# Patient Record
Sex: Male | Born: 1995 | Race: Black or African American | Hispanic: No | Marital: Married | Smoking: Current every day smoker
Health system: Southern US, Community
[De-identification: ages and names within clinical notes are randomized; demographics above are authoritative.]

## PROBLEM LIST (undated history)

## (undated) DIAGNOSIS — J039 Acute tonsillitis, unspecified: Secondary | ICD-10-CM

## (undated) HISTORY — PX: MOUTH SURGERY: SHX715

## (undated) HISTORY — PX: CHOLECYSTECTOMY: SHX55

---
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
## 2013-01-24 ENCOUNTER — Emergency Department (HOSPITAL_COMMUNITY)
Admission: EM | Admit: 2013-01-24 | Discharge: 2013-01-24 | Discharge status: Absent without leave | Payer: Self-pay | Source: Home / Self Care | Attending: Family Medicine | Admitting: Family Medicine

## 2013-01-29 ENCOUNTER — Emergency Department (INDEPENDENT_AMBULATORY_CARE_PROVIDER_SITE_OTHER)
Admission: EM | Admit: 2013-01-29 | Discharge: 2013-01-29 | Disposition: A | Discharge status: Home / Self Care | Payer: Self-pay | Source: Home / Self Care | Attending: Emergency Medicine | Admitting: Emergency Medicine

## 2013-01-29 ENCOUNTER — Encounter (HOSPITAL_COMMUNITY): Payer: Self-pay | Admitting: Emergency Medicine

## 2013-01-29 DIAGNOSIS — J309 Allergic rhinitis, unspecified: Secondary | ICD-10-CM

## 2013-01-29 DIAGNOSIS — J302 Other seasonal allergic rhinitis: Secondary | ICD-10-CM

## 2013-01-29 MED ORDER — FLUTICASONE PROPIONATE 50 MCG/ACT NA SUSP: 2.0000 | Freq: Every day | NASAL | Status: DC

## 2013-01-29 NOTE — ED Notes (Signed)
Eyes are itching and watery, sniffling, and denies cough.  Reports symptoms worsen the longer he is outside

## 2013-02-12 NOTE — ED Provider Notes (Signed)
History     CSN: [id number]  Arrival date & time 01/29/13  1232   First MD Initiated Contact with Patient 01/29/13 1346      Chief Complaint  Patient presents with  . Allergies     HPI Pt reports a > 1 weeK h/o seasonal allergy type symptoms that have not responded to OTC allergy meds. States he has had watery, itchy eyes, increased PND, sneezing and runny nose. OTC Claritin not helping. Pt admits to h/o "allergies" since childhood. Has taken meds in the past but unsure of names. Denies fever or cough.   Past Medical History  Diagnosis Date  . Tonsillitis     Past Surgical History  Procedure Laterality Date  . Cholecystectomy      Family History  Problem Relation Age of Onset  . Hypertension Father     History  Substance Use Topics  . Smoking status: Current Every Day Smoker -- 0.50 packs/day  . Smokeless tobacco: Not on file  . Alcohol Use: No     Comment: social      Review of Systems  Constitutional: Negative.   HENT: Positive for congestion, rhinorrhea, sneezing and postnasal drip. Negative for ear pain, nosebleeds, sore throat, facial swelling, sinus pressure and ear discharge.   Eyes: Positive for redness and itching.  Respiratory: Negative for cough, chest tightness, shortness of breath and wheezing.   Cardiovascular: Negative.   Gastrointestinal: Negative.   Endocrine: Negative.   Genitourinary: Negative.   Musculoskeletal: Negative.   Skin: Negative.   Allergic/Immunologic: Positive for environmental allergies.  Neurological: Negative.   Hematological: Negative.   Psychiatric/Behavioral: Negative.     Allergies  Review of patient's allergies indicates no known allergies.  Home Medications   Current Outpatient Rx  Name  Route  Sig  Dispense  Refill  . benzonatate (TESSALON) 200 MG capsule   Oral   Take 1 capsule (200 mg total) by mouth 3 (three) times daily as needed for cough.   30 capsule   0   . clindamycin (CLEOCIN) 300 MG capsule    Oral   Take 300 mg by mouth 4 (four) times daily.         . fluticasone (FLONASE) 50 MCG/ACT nasal spray   Nasal   Place 2 sprays into the nose daily. For 7 days then 1 spray in each nostril daily thereafter.   16 g   2   . naproxen (NAPROSYN) 500 MG tablet   Oral   Take 1 tablet (500 mg total) by mouth 2 (two) times daily.   30 tablet   0   . Polyethyl Glycol-Propyl Glycol (SYSTANE) 0.4-0.3 % SOLN   Ophthalmic   Apply 1 drop to eye every 3 (three) hours.   10 mL   0   . predniSONE (DELTASONE) 20 MG tablet   Oral   Take 40 mg by mouth daily.         . predniSONE (DELTASONE) 20 MG tablet   Oral   Take 2 tablets (40 mg total) by mouth daily.   10 tablet   0   . SUMAtriptan (IMITREX) 50 MG tablet   Oral   Take 1 tablet (50 mg total) by mouth every 2 (two) hours as needed for migraine.   10 tablet   0   . SUMAtriptan (IMITREX) 50 MG tablet   Oral   Take 1 tablet (50 mg total) by mouth every 2 (two) hours as needed for migraine.   10 tablet  0     BP 129/77  Pulse 84  Temp(Src) 97.9 F (36.6 C) (Oral)  Resp 16  SpO2 100%  Physical Exam  Constitutional: He is oriented to person, place, and time. He appears well-developed and well-nourished.  HENT:  Head: Normocephalic and atraumatic.  Right Ear: Tympanic membrane, external ear and ear canal normal.  Left Ear: Tympanic membrane, external ear and ear canal normal.  Nose: Rhinorrhea present.  Mouth/Throat: Uvula is midline, oropharynx is clear and moist and mucous membranes are normal.  Cobblestoning  Eyes: Conjunctivae are normal.  Neck: Neck supple.  Cardiovascular: Normal rate and regular rhythm.   Pulmonary/Chest: Effort normal and breath sounds normal.  Musculoskeletal: Normal range of motion.  Neurological: He is alert and oriented to person, place, and time.  Skin: Skin is warm and dry.  Psychiatric: He has a normal mood and affect.    ED Course  Procedures (including critical care  time)  Labs Reviewed - No data to display No results found.   1. Seasonal allergies       MDM  Seasonal allergy symptoms unrelieved by OTC remedies. H/o same since childhood. No fever or cough. Will treat with Flonase, Zatidor and encourage pt to continue an OTC anti-allergy medication.         Roma Kayser Sundiata Ferrick, NP 02/18/13 [phone]

## 2013-02-18 NOTE — ED Provider Notes (Signed)
Medical screening examination/treatment/procedure(s) were performed by non-physician practitioner and as supervising physician I was immediately available for consultation/collaboration.  Leslee Home, M.D.   Reuben Likes, MD 02/18/13 [phone]

## 2013-03-26 ENCOUNTER — Encounter (HOSPITAL_COMMUNITY): Payer: Self-pay | Admitting: Emergency Medicine

## 2013-03-26 ENCOUNTER — Emergency Department (HOSPITAL_COMMUNITY)
Admission: EM | Admit: 2013-03-26 | Discharge: 2013-03-26 | Discharge status: Against Medical Advice | Payer: Self-pay | Attending: Emergency Medicine | Admitting: Emergency Medicine

## 2013-03-26 DIAGNOSIS — R6883 Chills (without fever): Secondary | ICD-10-CM | POA: Insufficient documentation

## 2013-03-26 DIAGNOSIS — R1110 Vomiting, unspecified: Secondary | ICD-10-CM

## 2013-03-26 DIAGNOSIS — R059 Cough, unspecified: Secondary | ICD-10-CM | POA: Insufficient documentation

## 2013-03-26 DIAGNOSIS — F17200 Nicotine dependence, unspecified, uncomplicated: Secondary | ICD-10-CM | POA: Insufficient documentation

## 2013-03-26 DIAGNOSIS — R05 Cough: Secondary | ICD-10-CM | POA: Insufficient documentation

## 2013-03-26 DIAGNOSIS — R112 Nausea with vomiting, unspecified: Secondary | ICD-10-CM | POA: Insufficient documentation

## 2013-03-26 DIAGNOSIS — Z9089 Acquired absence of other organs: Secondary | ICD-10-CM | POA: Insufficient documentation

## 2013-03-26 DIAGNOSIS — R6889 Other general symptoms and signs: Secondary | ICD-10-CM | POA: Insufficient documentation

## 2013-03-26 DIAGNOSIS — R1084 Generalized abdominal pain: Secondary | ICD-10-CM | POA: Insufficient documentation

## 2013-03-26 DIAGNOSIS — J351 Hypertrophy of tonsils: Secondary | ICD-10-CM | POA: Insufficient documentation

## 2013-03-26 MED ORDER — ONDANSETRON 4 MG PO TBDP
4.0000 mg | ORAL_TABLET | Freq: Once | ORAL | Status: AC
  Administered 2013-03-26: 4 mg via ORAL
  Filled 2013-03-26: qty 1

## 2013-03-26 NOTE — ED Notes (Signed)
Onset of vomiting this AM.  Vomiting X 3.

## 2013-03-26 NOTE — ED Notes (Signed)
"  I already got something to drink from my girlfriend." Pt tolerated well.

## 2013-03-26 NOTE — ED Notes (Signed)
Pt is ready to be discharged.  MD notified.

## 2013-03-26 NOTE — ED Notes (Signed)
Pt walking out, "I can't wait anymore."

## 2013-03-26 NOTE — ED Provider Notes (Signed)
History     CSN: [id number]  Arrival date & time 03/26/13  4098   First MD Initiated Contact with Patient 03/26/13 [phone]      Chief Complaint  Patient presents with  . Emesis   (Consider location/radiation/quality/duration/timing/severity/associated sxs/prior treatment) HPI Comments: John Skinner is a 17 year old male with PMH s/p cholecystectomy, tonsillitis and seasonal allergies presenting to [facility] with complaints of vomiting x3 episodes since this morning and associated with productive cough x2 days.  His wife and baby are present in the room as well.  He explains that yesterday he developed a productive cough with brownish sputum that was worse at night and resulted in dry heaving.  This morning when he woke up around 7am, he was coughing, his stomach felt upset and had nausea that resulted in 3 large episodes of vomiting.  His wife explains that he vomited at last half a cup of undigested food,stomach acid, and liquid but denies any blood. He experienced abdominal pain with vomiting which subsequently resolved with resolution of vomiting.  Currently he still has nausea and does not have much of an appetite.  He also reports having chills this morning.  He denies any sick contacts, was able to eat lunch and dinner yesterday and had hamburger helper with his family around 10pm.  His wife ate the same things and she does not feel ill.  He denies any diarrhea or constipation, headaches, chest pain, shortness of breath, sore throat, hematemesis, or any urinary complaints at this time.  He reports prior similar episode several years ago when he had a virus and resolved over time.  He does smoke approximately 2-3 cigarettes per day, had a shot of liquor 2 days ago, and last marijuana use was approximately 1 year ago.  He denies any other illicit drug use.    Patient is a 17 y.o. male presenting with vomiting. The history is provided by the patient and the spouse. No language interpreter was used.   Emesis Severity:  Moderate Duration:  1 day Timing:  Intermittent Number of daily episodes:  3 so far Quality:  Undigested food and stomach contents Progression:  Unchanged Chronicity:  New Recent urination:  Normal Context: post-tussive   Context: not self-induced   Relieved by:  None tried Worsened by:  Nothing tried Ineffective treatments:  None tried Associated symptoms: abdominal pain, chills and cough   Associated symptoms: no arthralgias, no diarrhea, no fever, no headaches, no myalgias, no sore throat and no URI   Abdominal pain:    Location:  Generalized   Quality:  Aching (with vomiting)   Severity:  Mild   Onset quality:  Sudden   Timing:  Intermittent   Progression:  Resolved   Chronicity:  New Cough:    Cough characteristics:  Productive   Sputum characteristics:  Rusty and brown   Severity:  Mild   Onset quality:  Sudden   Duration:  2 days   Timing:  Sporadic   Progression:  Unchanged   Chronicity:  New Risk factors: alcohol use and prior abdominal surgery   Risk factors: no diabetes, no sick contacts and no travel to endemic areas   Risk factors comment:  Drank alcohol two days prior, hx of cholecystectomy, no sick contacts, ate hamburger helper last night   Past Medical History  Diagnosis Date  . Tonsillitis     Past Surgical History  Procedure Laterality Date  . Cholecystectomy     Family History  Problem Relation Age of  Onset  . Hypertension Father    History  Substance Use Topics  . Smoking status: Current Every Day Smoker -- 0.50 packs/day  . Smokeless tobacco: Not on file  . Alcohol Use: Yes     Comment: social     Review of Systems  Constitutional: Positive for chills and appetite change. Negative for fever.       Decreased appetite  HENT: Positive for sneezing. Negative for congestion, sore throat, rhinorrhea, trouble swallowing and neck stiffness.   Eyes: Negative.  Negative for visual disturbance.  Respiratory: Positive for  cough. Negative for chest tightness and shortness of breath.   Cardiovascular: Negative.  Negative for chest pain.  Gastrointestinal: Positive for vomiting and abdominal pain. Negative for diarrhea, constipation and blood in stool.  Endocrine: Negative.   Genitourinary: Negative.  Negative for dysuria and hematuria.  Musculoskeletal: Negative.  Negative for myalgias and arthralgias.  Skin: Negative.   Allergic/Immunologic: Negative.   Neurological: Negative.  Negative for weakness and headaches.  Hematological: Negative.   Psychiatric/Behavioral: Negative.    Allergies  Review of patient's allergies indicates no known allergies.  Home Medications   Current Outpatient Rx  Name  Route  Sig  Dispense  Refill  . SUMAtriptan (IMITREX) 50 MG tablet   Oral   Take 1 tablet (50 mg total) by mouth every 2 (two) hours as needed for migraine.   10 tablet   0    BP 125/68  Pulse 76  Temp(Src) 98 F (36.7 C) (Oral)  Resp 16  SpO2 100%  Physical Exam  Constitutional: He is oriented to person, place, and time. He appears well-developed and well-nourished. No distress.  HENT:  Head: Normocephalic and atraumatic.  Pharyngeal erythema, enlarged tonsils   Eyes: EOM are normal. Pupils are equal, round, and reactive to light.  Neck: Normal range of motion. Neck supple.  Cardiovascular: Normal rate, regular rhythm, normal heart sounds and intact distal pulses.   Pulmonary/Chest: Effort normal and breath sounds normal. He has no wheezes. He exhibits no tenderness.  Abdominal: Soft. Bowel sounds are normal. He exhibits no distension. There is no tenderness. There is no guarding.  Musculoskeletal: Normal range of motion. He exhibits no edema and no tenderness.  Neurological: He is alert and oriented to person, place, and time. No cranial nerve deficit.  Skin: Skin is warm and dry. He is not diaphoretic.  Psychiatric: He has a normal mood and affect. His behavior is normal. Judgment and thought  content normal.   ED Course  Procedures (including critical care time)  Labs Reviewed  URINALYSIS, ROUTINE W REFLEX MICROSCOPIC   No results found.   No diagnosis found.    MDM  John Skinner is a 17 year old male s/p cholecystectomy with hx of tonsilitis presenting with 3 episodes of non-bloody post tussive emesis x3 associated with intermittent abdominal pain x 1 day.  Productive cough with brown sputum x2 days.  No sick contacts, afebrile, but did have chills.    -zofran--nausea resolved -u/a -cxr    John Skinner left the emergency room against medical advice today.  Case discussed with Dr. Carmie End, MD 03/26/13 1120

## 2013-03-26 NOTE — ED Notes (Addendum)
Pt was encouraged to stay and continues to want to leave AMA.  Paper was not signed. MD notified.

## 2013-03-26 NOTE — ED Notes (Signed)
MD at bedside. 

## 2013-03-26 NOTE — ED Provider Notes (Signed)
I saw and evaluated the patient, reviewed the resident's note and I agree with the findings and plan. If applicable, I agree with the resident's interpretation of the EKG.  If applicable, I was present for critical portions of any procedures performed.  Vomiting x 3 since this morning. Productive cough x 2 days. No current abdominal pain. Chills without fever. No sick contacts or antibiotics use.  Appears well, moist mucus membranes, abdomen soft. Left before being discharged  Glynn Octave, MD 03/26/13 1734

## 2013-03-26 NOTE — ED Notes (Signed)
This RN into the room to medicate patient. RN informed patient of need for urine sample.  He states "I don't know why they are doing this extra stuff because all I came here for was medicine.  I got somewhere to be soon."

## 2013-10-21 ENCOUNTER — Emergency Department (INDEPENDENT_AMBULATORY_CARE_PROVIDER_SITE_OTHER)
Admission: EM | Admit: 2013-10-21 | Discharge: 2013-10-21 | Disposition: A | Discharge status: Home / Self Care | Payer: Medicaid Other | Source: Home / Self Care

## 2013-10-21 ENCOUNTER — Emergency Department (INDEPENDENT_AMBULATORY_CARE_PROVIDER_SITE_OTHER): Payer: Medicaid Other

## 2013-10-21 ENCOUNTER — Encounter (HOSPITAL_COMMUNITY): Payer: Self-pay | Admitting: Emergency Medicine

## 2013-10-21 DIAGNOSIS — S90129A Contusion of unspecified lesser toe(s) without damage to nail, initial encounter: Secondary | ICD-10-CM

## 2013-10-21 DIAGNOSIS — S90121A Contusion of right lesser toe(s) without damage to nail, initial encounter: Secondary | ICD-10-CM

## 2013-10-21 MED ORDER — TRAMADOL HCL 50 MG PO TABS: 50.0000 mg | ORAL_TABLET | Freq: Four times a day (QID) | ORAL | Status: DC | PRN

## 2013-10-21 MED ORDER — IBUPROFEN 800 MG PO TABS
800.0000 mg | ORAL_TABLET | Freq: Once | ORAL | Status: AC
  Administered 2013-10-21: 800 mg via ORAL

## 2013-10-21 MED ORDER — IBUPROFEN 800 MG PO TABS
ORAL_TABLET | ORAL | Status: AC
  Filled 2013-10-21: qty 1

## 2013-10-21 NOTE — ED Provider Notes (Signed)
CSN: [id number]     Arrival date & time 10/21/13  1439 History   First MD Initiated Contact with Patient 10/21/13 1522     Chief Complaint  Patient presents with  . Foot Injury   (Consider location/radiation/quality/duration/timing/severity/associated sxs/prior Treatment) HPI Comments: 18 year old male running through the house and stump his right fourth toe on a chair about 1 hour ago.    Past Medical History  Diagnosis Date  . Tonsillitis    Past Surgical History  Procedure Laterality Date  . Cholecystectomy     Family History  Problem Relation Age of Onset  . Hypertension Father    History  Substance Use Topics  . Smoking status: Current Every Day Smoker -- 0.50 packs/day  . Smokeless tobacco: Not on file  . Alcohol Use: Yes     Comment: social    Review of Systems  Constitutional: Negative.   Respiratory: Negative.   Gastrointestinal: Negative.   Genitourinary: Negative.   Musculoskeletal:       As per HPI  Skin: Negative.   Neurological: Negative for dizziness, weakness, numbness and headaches.    Allergies  Review of patient's allergies indicates no known allergies.  Home Medications   Current Outpatient Rx  Name  Route  Sig  Dispense  Refill  . SUMAtriptan (IMITREX) 50 MG tablet   Oral   Take 1 tablet (50 mg total) by mouth every 2 (two) hours as needed for migraine.   10 tablet   0   . traMADol (ULTRAM) 50 MG tablet   Oral   Take 1 tablet (50 mg total) by mouth every 6 (six) hours as needed.   12 tablet   0    BP 125/59  Pulse 78  Temp(Src) 98.4 F (36.9 C) (Oral)  Resp 18  SpO2 100% Physical Exam  Nursing note and vitals reviewed. Constitutional: He is oriented to person, place, and time. He appears well-developed and well-nourished.  HENT:  Head: Normocephalic and atraumatic.  Eyes: EOM are normal. Left eye exhibits no discharge.  Neck: Normal range of motion. Neck supple.  Musculoskeletal:  No discoloration, deformity or  swelling. Tender along the length of the 4th toe.  Cap RF< 2 sec  Neurological: He is alert and oriented to person, place, and time. No cranial nerve deficit.  Skin: Skin is warm and dry.  Psychiatric: He has a normal mood and affect.    ED Course  Procedures (including critical care time) Labs Review Labs Reviewed - No data to display Imaging Review Dg Foot Complete Right  10/21/2013   CLINICAL DATA:  Patient stubbed right foot on chair [place] an hr ago with pain in 4th toe  EXAM: RIGHT FOOT COMPLETE - 3+ VIEW  COMPARISON:  None.  FINDINGS: There is no evidence of fracture or dislocation. There is no evidence of arthropathy or other focal bone abnormality. Soft tissues are unremarkable.  IMPRESSION: Negative.   Electronically Signed   By: Esperanza Heiraymond  Rubner M.D.   On: 10/21/2013 16:15      MDM   1. Toe contusion, right, initial encounter     Buddy tape toes Ice Elevate Tramadol and ibuprofen     Hayden Rasmussenavid Ayrabella Labombard, NP 10/21/13 1627

## 2013-10-21 NOTE — ED Notes (Signed)
Pt  Reports  He  Stubbed  His  r  4th  Toe  On the  Leg of  An old  Chair         -  He  Has  Pain /  Swelling to the  Affected  Toe

## 2013-10-21 NOTE — Discharge Instructions (Signed)
Contusion A contusion is a deep bruise. Contusions are the result of an injury that caused bleeding under the skin. The contusion may turn blue, purple, or yellow. Minor injuries will give you a painless contusion, but more severe contusions may stay painful and swollen for a few weeks.  CAUSES  A contusion is usually caused by a blow, trauma, or direct force to an area of the body. SYMPTOMS   Swelling and redness of the injured area.  Bruising of the injured area.  Tenderness and soreness of the injured area.  Pain. DIAGNOSIS  The diagnosis can be made by taking a history and physical exam. An X-ray, CT scan, or MRI may be needed to determine if there were any associated injuries, such as fractures. TREATMENT  Specific treatment will depend on what area of the body was injured. In general, the best treatment for a contusion is resting, icing, elevating, and applying cold compresses to the injured area. Over-the-counter medicines may also be recommended for pain control. Ask your caregiver what the best treatment is for your contusion. HOME CARE INSTRUCTIONS   Put ice on the injured area.  Put ice in a plastic bag.  Place a towel between your skin and the bag.  Leave the ice on for 15-20 minutes, 03-04 times a day.  Only take over-the-counter or prescription medicines for pain, discomfort, or fever as directed by your caregiver. Your caregiver may recommend avoiding anti-inflammatory medicines (aspirin, ibuprofen, and naproxen) for 48 hours because these medicines may increase bruising.  Rest the injured area.  If possible, elevate the injured area to reduce swelling. SEEK IMMEDIATE MEDICAL CARE IF:   You have increased bruising or swelling.  You have pain that is getting worse.  Your swelling or pain is not relieved with medicines. MAKE SURE YOU:   Understand these instructions.  Will watch your condition.  Will get help right away if you are not doing well or get  worse. Document Released: 07/10/2005 Document Revised: 12/23/2011 Document Reviewed: 08/05/2011 [facility] Patient Information 2014 [place], [place].  Buddy Taping of Toes We have taped your toes together to keep them from moving. This is called "buddy taping" since we used a part of your own body to keep the injured part still. We placed soft padding between your toes to keep them from rubbing against each other. Buddy taping will help with healing and to reduce pain. Keep your toes buddy taped together for as long as directed by your caregiver. HOME CARE INSTRUCTIONS   Raise your injured area above the level of your heart while sitting or lying down. Prop it up with pillows.  An ice pack used every twenty minutes, while awake, for the first one to two days may be helpful. Put ice in a plastic bag and put a towel between the bag and your skin.  Watch for signs that the taping is too tight. These signs may be:  Numbness of your taped toes.  Coolness of your taped toes.  Color change in the area beyond the tape.  Increased pain.  If you have any of these signs, loosen or rewrap the tape. If you need to loosen or rewrap the buddy tape, make sure you use the padding again. SEEK IMMEDIATE MEDICAL CARE IF:   You have worse pain, swelling, inflammation (soreness), drainage or bleeding after you rewrap the tape.  Any new problems occur. MAKE SURE YOU:   Understand these instructions.  Will watch your condition.  Will get help right  away if you are not doing well or get worse. Document Released: 07/04/2004 Document Revised: 12/23/2011 Document Reviewed: 09/27/2008 [facility] Patient Information 2014 [place].

## 2013-10-25 NOTE — ED Provider Notes (Signed)
Medical screening examination/treatment/procedure(s) were performed by resident physician or non-physician practitioner and as supervising physician I was immediately available for consultation/collaboration.   Travelle Mcclimans DOUGLAS MD.   Bulah Lurie D Johnette Teigen, MD 10/25/13 1421 

## 2015-05-26 ENCOUNTER — Emergency Department (HOSPITAL_COMMUNITY)
Admission: EM | Admit: 2015-05-26 | Discharge: 2015-05-26 | Disposition: A | Discharge status: Home / Self Care | Payer: Medicaid Other | Attending: Emergency Medicine | Admitting: Emergency Medicine

## 2015-05-26 ENCOUNTER — Encounter (HOSPITAL_COMMUNITY): Payer: Self-pay | Admitting: *Deleted

## 2015-05-26 DIAGNOSIS — Z720 Tobacco use: Secondary | ICD-10-CM | POA: Diagnosis not present

## 2015-05-26 DIAGNOSIS — R1084 Generalized abdominal pain: Secondary | ICD-10-CM | POA: Diagnosis present

## 2015-05-26 DIAGNOSIS — Z8709 Personal history of other diseases of the respiratory system: Secondary | ICD-10-CM | POA: Diagnosis not present

## 2015-05-26 DIAGNOSIS — K2920 Alcoholic gastritis without bleeding: Secondary | ICD-10-CM | POA: Diagnosis not present

## 2015-05-26 LAB — COMPREHENSIVE METABOLIC PANEL
ALBUMIN: 4.8 g/dL (ref 3.5–5.0)
ALK PHOS: 55 U/L (ref 38–126)
ALT: 20 U/L (ref 17–63)
AST: 24 U/L (ref 15–41)
Anion gap: 9 (ref 5–15)
BILIRUBIN TOTAL: 1.6 mg/dL — AB (ref 0.3–1.2)
BUN: 10 mg/dL (ref 6–20)
CHLORIDE: 104 mmol/L (ref 101–111)
CO2: 27 mmol/L (ref 22–32)
CREATININE: 1.01 mg/dL (ref 0.61–1.24)
Calcium: 9.5 mg/dL (ref 8.9–10.3)
GFR calc Af Amer: >60 mL/min (ref >60)
Glucose, Bld: 105 mg/dL — ABNORMAL HIGH (ref 65–99)
POTASSIUM: 4 mmol/L (ref 3.5–5.1)
Sodium: 140 mmol/L (ref 135–145)
Total Protein: 7.9 g/dL (ref 6.5–8.1)

## 2015-05-26 LAB — CBC
HCT: 47.6 % (ref 39.0–52.0)
HEMOGLOBIN: 16 g/dL (ref 13.0–17.0)
MCH: 29.9 pg (ref 26.0–34.0)
MCHC: 33.6 g/dL (ref 30.0–36.0)
MCV: 89 fL (ref 78.0–100.0)
Platelets: 173 10*3/uL (ref 150–400)
RBC: 5.35 MIL/uL (ref 4.22–5.81)
RDW: 13.1 % (ref 11.5–15.5)
WBC: 12.9 10*3/uL — ABNORMAL HIGH (ref 4.0–10.5)

## 2015-05-26 LAB — URINALYSIS, ROUTINE W REFLEX MICROSCOPIC
Bilirubin Urine: NEGATIVE
Glucose, UA: NEGATIVE mg/dL
Hgb urine dipstick: NEGATIVE
Ketones, ur: 15 mg/dL — AB
Leukocytes, UA: NEGATIVE
NITRITE: NEGATIVE
PH: 8 (ref 5.0–8.0)
Protein, ur: NEGATIVE mg/dL
SPECIFIC GRAVITY, URINE: 1.025 (ref 1.005–1.030)
UROBILINOGEN UA: 1 mg/dL (ref 0.0–1.0)

## 2015-05-26 LAB — LIPASE, BLOOD: Lipase: 14 U/L — ABNORMAL LOW (ref 22–51)

## 2015-05-26 MED ORDER — ONDANSETRON HCL 4 MG PO TABS: 4.0000 mg | ORAL_TABLET | Freq: Four times a day (QID) | ORAL | Status: DC

## 2015-05-26 MED ORDER — ONDANSETRON 4 MG PO TBDP
8.0000 mg | ORAL_TABLET | Freq: Once | ORAL | Status: AC
  Administered 2015-05-26: 8 mg via ORAL
  Filled 2015-05-26: qty 2

## 2015-05-26 MED ORDER — SODIUM CHLORIDE 0.9 % IV BOLUS (SEPSIS)
1000.0000 mL | Freq: Once | INTRAVENOUS | Status: AC
  Administered 2015-05-26: 1000 mL via INTRAVENOUS

## 2015-05-26 NOTE — Discharge Instructions (Signed)

## 2015-05-26 NOTE — ED Provider Notes (Signed)
CSN: [id number]     Arrival date & time 05/26/15  1604 History   First MD Initiated Contact with Patient 05/26/15 1720     Chief Complaint  Patient presents with  . Abdominal Pain     (Consider location/radiation/quality/duration/timing/severity/associated sxs/prior Treatment) Patient is a 19 y.o. male presenting with abdominal pain.  Abdominal Pain Pain location:  Generalized Pain quality: cramping   Pain radiates to:  Does not radiate Pain severity:  Moderate Onset quality:  Gradual Duration:  1 day Timing:  Constant Progression:  Waxing and waning Chronicity:  New Context: alcohol use (last night)   Relieved by:  Nothing Worsened by:  Movement, palpation and eating Ineffective treatments:  None tried Associated symptoms: anorexia, diarrhea, nausea and vomiting   Associated symptoms: no dysuria and no fever     Past Medical History  Diagnosis Date  . Tonsillitis    Past Surgical History  Procedure Laterality Date  . Cholecystectomy     Family History  Problem Relation Age of Onset  . Hypertension Father    Social History  Substance Use Topics  . Smoking status: Current Every Day Smoker -- 0.50 packs/day  . Smokeless tobacco: None  . Alcohol Use: Yes     Comment: social    Review of Systems  Constitutional: Negative for fever.  Gastrointestinal: Positive for nausea, vomiting, abdominal pain, diarrhea and anorexia.  Genitourinary: Negative for dysuria.  All other systems reviewed and are negative.     Allergies  Review of patient's allergies indicates no known allergies.  Home Medications   Prior to Admission medications   Medication Sig Start Date End Date Taking? Authorizing Provider  ondansetron (ZOFRAN) 4 MG tablet Take 1 tablet (4 mg total) by mouth every 6 (six) hours. 05/26/15   Mirian Mo, MD  SUMAtriptan (IMITREX) 50 MG tablet Take 1 tablet (50 mg total) by mouth every 2 (two) hours as needed for migraine. Patient not taking: Reported on  05/26/2015 12/08/12   Reuben Likes, MD   BP 113/69 mmHg  Pulse 93  Temp(Src) 99.9 F (37.7 C)  Resp 18  Ht  (1.88 m)  Wt 156 lb 2 oz (70.818 kg)  BMI 20.04 kg/m2  SpO2 99% Physical Exam  Constitutional: He is oriented to person, place, and time. He appears well-developed and well-nourished.  HENT:  Head: Normocephalic and atraumatic.  Eyes: Conjunctivae and EOM are normal.  Neck: Normal range of motion. Neck supple.  Cardiovascular: Normal rate, regular rhythm and normal heart sounds.   Pulmonary/Chest: Effort normal and breath sounds normal. No respiratory distress.  Abdominal: He exhibits no distension. There is tenderness in the left upper quadrant. There is no rebound and no guarding.  Musculoskeletal: Normal range of motion.  Neurological: He is alert and oriented to person, place, and time.  Skin: Skin is warm and dry.  Vitals reviewed.   ED Course  Procedures (including critical care time) Labs Review Labs Reviewed  LIPASE, BLOOD - Abnormal; Notable for the following:    Lipase 14 (*)    All other components within normal limits  COMPREHENSIVE METABOLIC PANEL - Abnormal; Notable for the following:    Glucose, Bld 105 (*)    Total Bilirubin 1.6 (*)    All other components within normal limits  CBC - Abnormal; Notable for the following:    WBC 12.9 (*)    All other components within normal limits  URINALYSIS, ROUTINE W REFLEX MICROSCOPIC (NOT AT [facility]) - Abnormal; Notable for the  following:    Color, Urine AMBER (*)    Ketones, ur 15 (*)    All other components within normal limits    Imaging Review No results found. IMirian Mo, personally reviewed and evaluated these images and lab results as part of my medical decision-making.   EKG Interpretation None      MDM   Final diagnoses:  Gastritis, alcoholic    19 y.o. male with pertinent PMH of prior cholecystectomy presents with abdominal pain, nausea, vomiting in setting of moderate alcohol  use last night. Patient states he drinks 4-5 shots. This morning he woke with nausea and vomiting with crampy abdominal pain. No fevers, no sick contacts. On arrival vital signs and physical exam as above. Patient has isolated very mild tenderness of left upper quadrant. Symptoms consistent with chronic gastritis, doubt viral gastroenteritis however this possible. Similarly doubt appendicitis given the benign exam.  Symptoms improved with Zofran and normal saline bolus. Discharged home in stable condition with standard return precautions..    I have reviewed all laboratory and imaging studies if ordered as above  1. Gastritis, alcoholic         Mirian Mo, MD 05/26/15 1818

## 2015-05-26 NOTE — ED Notes (Signed)
The pt is c/o abd pain  With n v and  Soft stool today.  He was ok last pm   He was drinking alcohol last pm but today he cannot hold any liquids down

## 2015-05-30 ENCOUNTER — Emergency Department (HOSPITAL_COMMUNITY)
Admission: EM | Admit: 2015-05-30 | Discharge: 2015-05-30 | Discharge status: Against Medical Advice | Payer: Medicaid Other | Attending: Emergency Medicine | Admitting: Emergency Medicine

## 2015-05-30 DIAGNOSIS — K3189 Other diseases of stomach and duodenum: Secondary | ICD-10-CM | POA: Diagnosis present

## 2015-05-30 DIAGNOSIS — K5900 Constipation, unspecified: Secondary | ICD-10-CM | POA: Diagnosis not present

## 2015-05-30 DIAGNOSIS — Z720 Tobacco use: Secondary | ICD-10-CM | POA: Insufficient documentation

## 2015-05-30 NOTE — ED Notes (Signed)
Pt arrives via EMS from home. Pt states that "when I eat I get too full, the lining on my stomach is weak." Also stated that he is constipated. VSS.

## 2015-05-30 NOTE — ED Notes (Signed)
No answer called x1 

## 2016-05-06 ENCOUNTER — Encounter (HOSPITAL_COMMUNITY): Payer: Self-pay | Admitting: Emergency Medicine

## 2016-05-06 ENCOUNTER — Emergency Department (HOSPITAL_COMMUNITY)
Admission: EM | Admit: 2016-05-06 | Discharge: 2016-05-06 | Disposition: A | Discharge status: Home / Self Care | Payer: Medicaid Other | Attending: Emergency Medicine | Admitting: Emergency Medicine

## 2016-05-06 ENCOUNTER — Emergency Department (HOSPITAL_COMMUNITY): Payer: Medicaid Other

## 2016-05-06 DIAGNOSIS — W2201XA Walked into wall, initial encounter: Secondary | ICD-10-CM | POA: Insufficient documentation

## 2016-05-06 DIAGNOSIS — F17200 Nicotine dependence, unspecified, uncomplicated: Secondary | ICD-10-CM | POA: Insufficient documentation

## 2016-05-06 DIAGNOSIS — Y939 Activity, unspecified: Secondary | ICD-10-CM | POA: Insufficient documentation

## 2016-05-06 DIAGNOSIS — S62308A Unspecified fracture of other metacarpal bone, initial encounter for closed fracture: Secondary | ICD-10-CM

## 2016-05-06 DIAGNOSIS — Y999 Unspecified external cause status: Secondary | ICD-10-CM | POA: Insufficient documentation

## 2016-05-06 DIAGNOSIS — Y929 Unspecified place or not applicable: Secondary | ICD-10-CM | POA: Insufficient documentation

## 2016-05-06 DIAGNOSIS — S62398A Other fracture of other metacarpal bone, initial encounter for closed fracture: Secondary | ICD-10-CM | POA: Insufficient documentation

## 2016-05-06 MED ORDER — HYDROCODONE-ACETAMINOPHEN 5-325 MG PO TABS: 1.0000 | ORAL_TABLET | ORAL | 0 refills | Status: DC | PRN

## 2016-05-06 MED ORDER — IBUPROFEN 800 MG PO TABS: 800.0000 mg | ORAL_TABLET | Freq: Three times a day (TID) | ORAL | 0 refills | Status: DC | PRN

## 2016-05-06 NOTE — ED Notes (Signed)
Paged ortho tech to apply short arm splint.

## 2016-05-06 NOTE — ED Triage Notes (Signed)
Patient punched a wall yesterday , presents with left hand pain/swelling .

## 2016-05-06 NOTE — ED Notes (Signed)
Pt significant requesting bus pass for pt and for self. This RN discussed w/ Shanda Bumps, Consulting civil engineer. Offered 1 bus pass to pt. Family member voiced frustration over not receiving bus pass, stating "pt can take bus and I will just have to walk."

## 2016-05-06 NOTE — Discharge Instructions (Signed)
Read the information below.  Use the prescribed medication as directed.  Please discuss all new medications with your pharmacist.  Do not take additional tylenol while taking the prescribed pain medication to avoid overdose.  You may return to the Emergency Department at any time for worsening condition or any new symptoms that concern you.    If you develop uncontrolled pain, weakness or numbness of the extremity, severe discoloration of the skin, or you are unable to move your fingers, return to the ER for a recheck.    °

## 2016-05-06 NOTE — Progress Notes (Signed)
Orthopedic Tech Progress Note Patient Details:  John Skinner [birth year] [id number]  Ortho Devices Type of Ortho Device: Ace wrap, Ulna gutter splint Ortho Device/Splint Interventions: Application   Saul Fordyce 05/06/2016, 8:17 AM

## 2016-05-06 NOTE — ED Notes (Signed)
Pt verbalized understanding of d/c instructions, prescriptions, and follow-up care. No further questions/concerns, VSS, ambulatory w/ steady gait (refused wheelchair) 

## 2016-05-06 NOTE — ED Provider Notes (Signed)
[facility]-EMERGENCY DEPT Provider Note   CSN: [id number] Arrival date & time: 05/06/16  4098  First Provider Contact:  First MD Initiated Contact with Patient 05/06/16 [phone]        History   Chief Complaint Chief Complaint  Patient presents with  . Hand Injury    HPI John Skinner is a 20 y.o. male.  HPI   Left handed patient presents with left hand pain that began yesterday after punching a closet door.  States he was angry and punched a closet door.  Has pain and swelling over the 5th metacarpal area.  Has taken tylenol without relief.  Denies weakness, numbness, cuts in skin, other injury.  Pt is not currently working.    Past Medical History:  Diagnosis Date  . Tonsillitis     There are no active problems to display for this patient.   Past Surgical History:  Procedure Laterality Date  . CHOLECYSTECTOMY         Home Medications    Prior to Admission medications   Not on File    Family History Family History  Problem Relation Age of Onset  . Hypertension Father     Social History Social History  Substance Use Topics  . Smoking status: Current Every Day Smoker    Packs/day: 0.50  . Smokeless tobacco: Not on file  . Alcohol use Yes     Comment: social     Allergies   Review of patient's allergies indicates no known allergies.   Review of Systems Review of Systems  Constitutional: Negative for activity change, appetite change and fever.  Musculoskeletal: Positive for arthralgias.  Skin: Negative for wound.  Allergic/Immunologic: Negative for immunocompromised state.  Neurological: Negative for weakness and numbness.  Hematological: Does not bruise/bleed easily.     Physical Exam Updated Vital Signs BP 134/69 (BP Location: Left Arm)   Pulse 72   Temp 98 F (36.7 C) (Oral)   Resp 16   Ht  (1.854 m)   Wt 90.7 kg   SpO2 100%   BMI 26.39 kg/m   Physical Exam  Constitutional: He appears well-developed and well-nourished.  HENT:    Head: Normocephalic and atraumatic.  Neck: Neck supple.  Pulmonary/Chest: Effort normal.  Musculoskeletal:  Diffuse tenderness and edema over ulnar aspect of hand, specifically around 5th metacarpal.  Full active range of motion of all digits, strength 5/5, sensation intact, capillary refill < 2 seconds.  No other noted bony tenderness of wrist or forearm.    Neurological: He is alert.  Nursing note and vitals reviewed.    ED Treatments / Results  Labs (all labs ordered are listed, but only abnormal results are displayed) Labs Reviewed - No data to display  EKG  EKG Interpretation None       Radiology Dg Hand Complete Left  Result Date: 05/06/2016 CLINICAL DATA:  Hit hand on door jam 1 day prior.  Pain and swelling EXAM: LEFT HAND - COMPLETE 3+ VIEW COMPARISON:  October 17, 2010 FINDINGS: Frontal, oblique, lateral views were obtained. There is a comminuted fracture of the proximal aspect of the fifth metacarpal with multiple displaced fracture fragments involving proximal aspect of the fifth metacarpal. There is mild dorsal displacement of the distal major fracture fragment with respect to the proximal fragments. There is soft tissue swelling in this area, primarily medially and dorsally. No other fractures are evident. No dislocation. Joint spaces appear normal. No erosive change. IMPRESSION: Comminuted fracture proximal aspect of the fifth metacarpal  with displaced fracture fragments. No other fractures. No dislocations. No apparent arthropathy. Electronically Signed   By: Bretta Bang III M.D.   On: 05/06/2016 07:17   Procedures Procedures (including critical care time)  Medications Ordered in ED Medications - No data to display   Initial Impression / Assessment and Plan / ED Course  I have reviewed the triage vital signs and the nursing notes.  Pertinent labs & imaging results that were available during my care of the patient were reviewed by me and considered in my  medical decision making (see chart for details).  Clinical Course  Value Comment By Time  DG Hand Complete Left (Reviewed) Trixie Dredge, PA-C 07/24 0741  DG Hand Complete Left (Reviewed) Trixie Dredge, PA-C 07/24 0741    Afebrile, nontoxic patient with injury to his left hand while punching a closet door.   Xray demonstrates fifth metacarpal fracture.  No skin disruption.  Neurovascularly intact.    D/C home with ulnar gutter splint, pain medication, hand surgery follow up Izora Ribas).   Discussed result, findings, treatment, and follow up  with patient.  Pt given return precautions.  Pt verbalizes understanding and agrees with plan.      Final Clinical Impressions(s) / ED Diagnoses   Final diagnoses:  Closed fracture of 5th metacarpal, initial encounter    New Prescriptions New Prescriptions   HYDROCODONE-ACETAMINOPHEN (NORCO/VICODIN) 5-325 MG TABLET    Take 1 tablet by mouth every 4 (four) hours as needed for severe pain.   IBUPROFEN (ADVIL,MOTRIN) 800 MG TABLET    Take 1 tablet (800 mg total) by mouth every 8 (eight) hours as needed for mild pain or moderate pain.     [place], PA-C 05/06/16 4103    Rolan Bucco, MD 05/06/16 [phone]

## 2016-05-15 ENCOUNTER — Emergency Department (HOSPITAL_COMMUNITY)
Admission: EM | Admit: 2016-05-15 | Discharge: 2016-05-15 | Disposition: A | Discharge status: Home / Self Care | Payer: Medicaid Other | Attending: Emergency Medicine | Admitting: Emergency Medicine

## 2016-05-15 ENCOUNTER — Encounter (HOSPITAL_COMMUNITY): Payer: Self-pay

## 2016-05-15 DIAGNOSIS — Z09 Encounter for follow-up examination after completed treatment for conditions other than malignant neoplasm: Secondary | ICD-10-CM

## 2016-05-15 DIAGNOSIS — S62307S Unspecified fracture of fifth metacarpal bone, left hand, sequela: Secondary | ICD-10-CM | POA: Insufficient documentation

## 2016-05-15 DIAGNOSIS — X58XXXS Exposure to other specified factors, sequela: Secondary | ICD-10-CM | POA: Insufficient documentation

## 2016-05-15 DIAGNOSIS — F17200 Nicotine dependence, unspecified, uncomplicated: Secondary | ICD-10-CM | POA: Insufficient documentation

## 2016-05-15 DIAGNOSIS — S62308S Unspecified fracture of other metacarpal bone, sequela: Secondary | ICD-10-CM

## 2016-05-15 NOTE — Discharge Instructions (Signed)
Please follow up with Dr. Izora Ribas as soon as possible for further evaluation and treatment of your hand fracture. You can continue taking ibuprofen and Vicodin as needed for your pain. Do not drive or operate machinery when taking Vicodin and only take as prescribed. Please return to the emergency department if you develop any new or worsening symptoms including increasing pain, numbness, tingling, odor to your splint, splint falling off, or any other new or concerning symptom.

## 2016-05-15 NOTE — ED Provider Notes (Signed)
[facility] Provider Note   CSN: [id number] Arrival date & time: 05/15/16  4098  First Provider Contact:  None       History   Chief Complaint Chief Complaint  Patient presents with  . Follow-up    HPI John Skinner is a left handed 20 y.o. male with history of left fifth metacarpal fracture on 05/06/2016. Patient was unable to follow-up with orthopedics this week because he realized he did not have insurance. He and his wife are in the process of applying for insurance and they hope to be able to have her from insurance within the week. Patient returned because he was told he was not to keep his splint on for more than 3-4 days and he thought that he needed to have changed. Patient has had good pain control with ibuprofen and only intermittent Vicodin. Patient states she does not like to take the Vicodin and he has not needed it very much. Patient denies any numbness or tingling to his fingers, and has been able to move his fingers freely. Patient denies any problems with the splint, including odor or falling apart. Patient denies any fevers, chest pain, shortness of breath, abdominal pain, nausea, vomiting.  HPI  Past Medical History:  Diagnosis Date  . Tonsillitis     There are no active problems to display for this patient.   Past Surgical History:  Procedure Laterality Date  . CHOLECYSTECTOMY         Home Medications    Prior to Admission medications   Medication Sig Start Date End Date Taking? Authorizing Provider  HYDROcodone-acetaminophen (NORCO/VICODIN) 5-325 MG tablet Take 1 tablet by mouth every 4 (four) hours as needed for severe pain. 05/06/16   Trixie Dredge, PA-C  ibuprofen (ADVIL,MOTRIN) 800 MG tablet Take 1 tablet (800 mg total) by mouth every 8 (eight) hours as needed for mild pain or moderate pain. 05/06/16   Trixie Dredge, PA-C    Family History Family History  Problem Relation Age of Onset  . Hypertension Father     Social History Social History    Substance Use Topics  . Smoking status: Current Every Day Smoker    Packs/day: 0.50  . Smokeless tobacco: Never Used  . Alcohol use Yes     Comment: social     Allergies   Review of patient's allergies indicates no known allergies.   Review of Systems Review of Systems  Constitutional: Negative for chills and fever.  HENT: Negative for facial swelling.   Respiratory: Negative for shortness of breath.   Cardiovascular: Negative for chest pain.  Gastrointestinal: Negative for abdominal pain, nausea and vomiting.  Musculoskeletal: Positive for arthralgias. Negative for back pain.  Skin: Negative for rash and wound.  Psychiatric/Behavioral: The patient is not nervous/anxious.      Physical Exam Updated Vital Signs BP 133/84   Pulse 74   Temp 97.9 F (36.6 C) (Oral)   Resp 19   Ht  (1.854 m)   Wt 90.7 kg   SpO2 100%   BMI 26.39 kg/m   Physical Exam  Constitutional: He appears well-developed and well-nourished. No distress.  HENT:  Head: Normocephalic and atraumatic.  Mouth/Throat: Oropharynx is clear and moist. No oropharyngeal exudate.  Eyes: Conjunctivae are normal. Pupils are equal, round, and reactive to light. Right eye exhibits no discharge. Left eye exhibits no discharge. No scleral icterus.  Neck: Normal range of motion. Neck supple. No thyromegaly present.  Cardiovascular: Normal rate, regular rhythm and normal heart  sounds.  Exam reveals no gallop and no friction rub.   No murmur heard. Pulses:      Radial pulses are 2+ on the right side.  Pulmonary/Chest: Effort normal and breath sounds normal. No stridor. No respiratory distress. He has no wheezes. He has no rales.  Abdominal: Soft. Bowel sounds are normal. He exhibits no distension. There is no tenderness. There is no rebound and no guarding.  Musculoskeletal: He exhibits no edema.  Splint in good condition; cap refill of left exposed fingers <2secs; normal sensation; able to move fingers freely   Lymphadenopathy:    He has no cervical adenopathy.  Neurological: He is alert. Coordination normal.  Skin: Skin is warm and dry. No rash noted. He is not diaphoretic. No pallor.  Psychiatric: He has a normal mood and affect.  Nursing note and vitals reviewed.    ED Treatments / Results  Labs (all labs ordered are listed, but only abnormal results are displayed) Labs Reviewed - No data to display  EKG  EKG Interpretation None       Radiology No results found.  Procedures Procedures (including critical care time)  Medications Ordered in ED Medications - No data to display   Initial Impression / Assessment and Plan / ED Course  I have reviewed the triage vital signs and the nursing notes.  Pertinent labs & imaging results that were available during my care of the patient were reviewed by me and considered in my medical decision making (see chart for details).  Clinical Course     Final Clinical Impressions(s) / ED Diagnoses   Final diagnoses:  Follow up  Closed fracture of 5th metacarpal, sequela   Follow-up for inability to follow-up with sore throat. Patient's pain has been well-controlled with only intermittent Vicodin and ibuprofen. Patient denies any numbness or tingling to his fingers, odor from his splint. Patient encouraged to follow up with hand, Dr. Izora Ribas, as soon as possible for further evaluation. Return precautions discussed. Patient understands and agrees with plan. I discussed patient case with Dr. Lynelle Doctor who is in agreement with plan. Patient vitals stable throughout ED course and discharged in satisfactory condition.  New Prescriptions New Prescriptions   No medications on file     Emi Holes, Cordelia Poche 05/15/16 2993    Linwood Dibbles, MD 05/16/16 [phone]

## 2016-05-15 NOTE — ED Triage Notes (Signed)
Pt reports he is here to have his splint changed. Pt had splint placed last Monday and was told to follow up at orthopedic MD and was unable to. Pt reports he was told not to keep the cast on for more than 3-4 days. No complaints of pain in the extremity, skin color appropriate.

## 2019-01-25 ENCOUNTER — Observation Stay (HOSPITAL_COMMUNITY)
Admission: EM | Admit: 2019-01-25 | Discharge: 2019-01-25 | Disposition: A | Discharge status: Home / Self Care | Payer: Medicaid Other | Attending: Internal Medicine | Admitting: Internal Medicine

## 2019-01-25 ENCOUNTER — Other Ambulatory Visit: Payer: Self-pay

## 2019-01-25 ENCOUNTER — Encounter (HOSPITAL_COMMUNITY): Payer: Self-pay | Admitting: Emergency Medicine

## 2019-01-25 ENCOUNTER — Emergency Department (HOSPITAL_COMMUNITY): Payer: Medicaid Other

## 2019-01-25 DIAGNOSIS — F1290 Cannabis use, unspecified, uncomplicated: Secondary | ICD-10-CM | POA: Insufficient documentation

## 2019-01-25 DIAGNOSIS — Z9049 Acquired absence of other specified parts of digestive tract: Secondary | ICD-10-CM | POA: Diagnosis not present

## 2019-01-25 DIAGNOSIS — F17210 Nicotine dependence, cigarettes, uncomplicated: Secondary | ICD-10-CM | POA: Insufficient documentation

## 2019-01-25 DIAGNOSIS — R1084 Generalized abdominal pain: Secondary | ICD-10-CM | POA: Diagnosis not present

## 2019-01-25 DIAGNOSIS — Z7289 Other problems related to lifestyle: Secondary | ICD-10-CM | POA: Diagnosis not present

## 2019-01-25 DIAGNOSIS — R112 Nausea with vomiting, unspecified: Secondary | ICD-10-CM | POA: Diagnosis present

## 2019-01-25 DIAGNOSIS — D72829 Elevated white blood cell count, unspecified: Secondary | ICD-10-CM | POA: Diagnosis not present

## 2019-01-25 DIAGNOSIS — R109 Unspecified abdominal pain: Secondary | ICD-10-CM

## 2019-01-25 DIAGNOSIS — D582 Other hemoglobinopathies: Secondary | ICD-10-CM

## 2019-01-25 DIAGNOSIS — F1210 Cannabis abuse, uncomplicated: Secondary | ICD-10-CM

## 2019-01-25 DIAGNOSIS — R1110 Vomiting, unspecified: Secondary | ICD-10-CM | POA: Insufficient documentation

## 2019-01-25 LAB — RAPID URINE DRUG SCREEN, HOSP PERFORMED
Amphetamines: NOT DETECTED
Barbiturates: NOT DETECTED
Benzodiazepines: NOT DETECTED
Cocaine: NOT DETECTED
Opiates: POSITIVE — AB
Tetrahydrocannabinol: POSITIVE — AB

## 2019-01-25 LAB — COMPREHENSIVE METABOLIC PANEL
ALT: 23 U/L (ref 0–44)
AST: 26 U/L (ref 15–41)
Albumin: 4.2 g/dL (ref 3.5–5.0)
Alkaline Phosphatase: 54 U/L (ref 38–126)
Anion gap: 12 (ref 5–15)
BUN: 10 mg/dL (ref 6–20)
CO2: 25 mmol/L (ref 22–32)
Calcium: 9.4 mg/dL (ref 8.9–10.3)
Chloride: 103 mmol/L (ref 98–111)
Creatinine, Ser: 1.09 mg/dL (ref 0.61–1.24)
GFR calc Af Amer: >60 mL/min (ref >60)
GFR calc non Af Amer: >60 mL/min (ref >60)
Glucose, Bld: 125 mg/dL — ABNORMAL HIGH (ref 70–99)
Potassium: 3.6 mmol/L (ref 3.5–5.1)
Sodium: 140 mmol/L (ref 135–145)
Total Bilirubin: 0.5 mg/dL (ref 0.3–1.2)
Total Protein: 6.6 g/dL (ref 6.5–8.1)

## 2019-01-25 LAB — CBC WITH DIFFERENTIAL/PLATELET
Abs Immature Granulocytes: 0.03 10*3/uL (ref 0.00–0.07)
Basophils Absolute: 0.1 10*3/uL (ref 0.0–0.1)
Basophils Relative: 0 %
Eosinophils Absolute: 0.1 10*3/uL (ref 0.0–0.5)
Eosinophils Relative: 1 %
HCT: 52.7 % — ABNORMAL HIGH (ref 39.0–52.0)
Hemoglobin: 17.1 g/dL — ABNORMAL HIGH (ref 13.0–17.0)
Immature Granulocytes: 0 %
Lymphocytes Relative: 20 %
Lymphs Abs: 2.5 10*3/uL (ref 0.7–4.0)
MCH: 29.1 pg (ref 26.0–34.0)
MCHC: 32.4 g/dL (ref 30.0–36.0)
MCV: 89.6 fL (ref 80.0–100.0)
Monocytes Absolute: 1 10*3/uL (ref 0.1–1.0)
Monocytes Relative: 8 %
Neutro Abs: 8.9 10*3/uL — ABNORMAL HIGH (ref 1.7–7.7)
Neutrophils Relative %: 71 %
Platelets: 209 10*3/uL (ref 150–400)
RBC: 5.88 MIL/uL — ABNORMAL HIGH (ref 4.22–5.81)
RDW: 12.9 % (ref 11.5–15.5)
WBC: 12.6 10*3/uL — ABNORMAL HIGH (ref 4.0–10.5)
nRBC: 0 % (ref 0.0–0.2)

## 2019-01-25 LAB — LIPASE, BLOOD: Lipase: 107 U/L — ABNORMAL HIGH (ref 11–51)

## 2019-01-25 MED ORDER — PANTOPRAZOLE SODIUM 40 MG PO TBEC: 40.0000 mg | DELAYED_RELEASE_TABLET | Freq: Every day | ORAL | Status: DC

## 2019-01-25 MED ORDER — CAPSAICIN 0.025 % EX CREA
TOPICAL_CREAM | Freq: Two times a day (BID) | CUTANEOUS | Status: DC
  Administered 2019-01-25: 04:00:00 via TOPICAL
  Filled 2019-01-25: qty 60

## 2019-01-25 MED ORDER — PROMETHAZINE HCL 25 MG PO TABS: 12.5000 mg | ORAL_TABLET | Freq: Four times a day (QID) | ORAL | Status: DC | PRN

## 2019-01-25 MED ORDER — MORPHINE SULFATE (PF) 2 MG/ML IV SOLN
2.0000 mg | Freq: Once | INTRAVENOUS | Status: AC
  Administered 2019-01-25: 2 mg via INTRAVENOUS
  Filled 2019-01-25: qty 1

## 2019-01-25 MED ORDER — ENOXAPARIN SODIUM 40 MG/0.4ML ~~LOC~~ SOLN: 40.0000 mg | SUBCUTANEOUS | Status: DC

## 2019-01-25 MED ORDER — NICOTINE 21 MG/24HR TD PT24: 21.0000 mg | MEDICATED_PATCH | Freq: Every day | TRANSDERMAL | Status: DC

## 2019-01-25 MED ORDER — ONDANSETRON HCL 4 MG/2ML IJ SOLN
4.0000 mg | Freq: Once | INTRAMUSCULAR | Status: AC
  Administered 2019-01-25: 4 mg via INTRAVENOUS
  Filled 2019-01-25: qty 2

## 2019-01-25 MED ORDER — ACETAMINOPHEN 650 MG RE SUPP: 650.0000 mg | Freq: Four times a day (QID) | RECTAL | Status: DC | PRN

## 2019-01-25 MED ORDER — LACTATED RINGERS IV BOLUS
1000.0000 mL | Freq: Once | INTRAVENOUS | Status: AC
  Administered 2019-01-25: 1000 mL via INTRAVENOUS

## 2019-01-25 MED ORDER — POTASSIUM CHLORIDE IN NACL 40-0.9 MEQ/L-% IV SOLN
INTRAVENOUS | Status: DC
  Filled 2019-01-25: qty 1000

## 2019-01-25 MED ORDER — PROMETHAZINE HCL 12.5 MG PO TABS: 12.5000 mg | ORAL_TABLET | Freq: Four times a day (QID) | ORAL | 0 refills | Status: DC | PRN

## 2019-01-25 MED ORDER — SODIUM CHLORIDE 0.9 % IV BOLUS
1000.0000 mL | Freq: Once | INTRAVENOUS | Status: AC
  Administered 2019-01-25: 1000 mL via INTRAVENOUS

## 2019-01-25 MED ORDER — PROMETHAZINE HCL 25 MG/ML IJ SOLN
12.5000 mg | Freq: Once | INTRAMUSCULAR | Status: AC
  Administered 2019-01-25: 12.5 mg via INTRAVENOUS
  Filled 2019-01-25: qty 1

## 2019-01-25 MED ORDER — IOHEXOL 300 MG/ML  SOLN
100.0000 mL | Freq: Once | INTRAMUSCULAR | Status: AC | PRN
  Administered 2019-01-25: 100 mL via INTRAVENOUS

## 2019-01-25 MED ORDER — ACETAMINOPHEN 325 MG PO TABS: 650.0000 mg | ORAL_TABLET | Freq: Four times a day (QID) | ORAL | Status: DC | PRN

## 2019-01-25 NOTE — ED Notes (Signed)
Admitting at bedside 

## 2019-01-25 NOTE — ED Notes (Signed)
Urinal at bedside and patient aware of needing sample.  

## 2019-01-25 NOTE — ED Notes (Signed)
ED TO INPATIENT HANDOFF REPORT  ED Nurse Name and Phone #: Florentina Addison [phone]  S Name/Age/Gender John Skinner 23 y.o. male Room/Bed: 018C/018C  Code Status   Code Status: Full Code  Home/SNF/Other Home Patient oriented to: self, place, time and situation Is this baseline? Yes   Triage Complete: Triage complete  Chief Complaint sick  Triage Note BIB [facility] from home with c/o of emesis since yesterday. Hx of GI issues.    Allergies No Known Allergies  Level of Care/Admitting Diagnosis ED Disposition    ED Disposition Condition Comment   Admit  Hospital Area: [facility] [[id number]]  Level of Care: Med-Surg [16]  Diagnosis: Intractable nausea and vomiting [720114]  Admitting Physician: Inez Catalina [phone]  Attending Physician: Inez Catalina [[id number]]  PT Class (Do Not Modify): Observation [104]  PT Acc Code (Do Not Modify): Observation [10022]       B Medical/Surgery History Past Medical History:  Diagnosis Date  . Tonsillitis    Past Surgical History:  Procedure Laterality Date  . CHOLECYSTECTOMY       A IV Location/Drains/Wounds Patient Lines/Drains/Airways Status   Active Line/Drains/Airways    Name:   Placement date:   Placement time:   Site:   Days:   Peripheral IV 01/25/19 Left Antecubital   01/25/19    0309    Antecubital   less than 1          Intake/Output Last 24 hours No intake or output data in the 24 hours ending 01/25/19 0751  Labs/Imaging Results for orders placed or performed during the hospital encounter of 01/25/19 (from the past 48 hour(s))  CBC with Differential     Status: Abnormal   Collection Time: 01/25/19  3:11 AM  Result Value Ref Range   WBC 12.6 (H) 4.0 - 10.5 K/uL   RBC 5.88 (H) 4.22 - 5.81 MIL/uL   Hemoglobin 17.1 (H) 13.0 - 17.0 g/dL   HCT 29.5 (H) 62.1 - 30.8 %   MCV 89.6 80.0 - 100.0 fL   MCH 29.1 26.0 - 34.0 pg   MCHC 32.4 30.0 - 36.0 g/dL   RDW 65.7 84.6 - 96.2 %   Platelets 209 150 - 400 K/uL    nRBC 0.0 0.0 - 0.2 %   Neutrophils Relative % 71 %   Neutro Abs 8.9 (H) 1.7 - 7.7 K/uL   Lymphocytes Relative 20 %   Lymphs Abs 2.5 0.7 - 4.0 K/uL   Monocytes Relative 8 %   Monocytes Absolute 1.0 0.1 - 1.0 K/uL   Eosinophils Relative 1 %   Eosinophils Absolute 0.1 0.0 - 0.5 K/uL   Basophils Relative 0 %   Basophils Absolute 0.1 0.0 - 0.1 K/uL   Immature Granulocytes 0 %   Abs Immature Granulocytes 0.03 0.00 - 0.07 K/uL    Comment: Performed at [facility], [address] [place], [place] [address]  Comprehensive metabolic panel     Status: Abnormal   Collection Time: 01/25/19  3:11 AM  Result Value Ref Range   Sodium 140 135 - 145 mmol/L   Potassium 3.6 3.5 - 5.1 mmol/L   Chloride 103 98 - 111 mmol/L   CO2 25 22 - 32 mmol/L   Glucose, Bld 125 (H) 70 - 99 mg/dL   BUN 10 6 - 20 mg/dL   Creatinine, Ser 1.32 0.61 - 1.24 mg/dL   Calcium 9.4 8.9 - 44.0 mg/dL   Total Protein 6.6 6.5 - 8.1 g/dL  Albumin 4.2 3.5 - 5.0 g/dL   AST 26 15 - 41 U/L   ALT 23 0 - 44 U/L   Alkaline Phosphatase 54 38 - 126 U/L   Total Bilirubin 0.5 0.3 - 1.2 mg/dL   GFR calc non Af Amer >60 >60 mL/min   GFR calc Af Amer >60 >60 mL/min   Anion gap 12 5 - 15    Comment: Performed at [facility], [address], [place], [place] [address]  Lipase, blood     Status: Abnormal   Collection Time: 01/25/19  3:11 AM  Result Value Ref Range   Lipase 107 (H) 11 - 51 U/L    Comment: Performed at [facility], [address], [place], [place] [address]   Ct Abdomen Pelvis W Contrast  Result Date: 01/25/2019 CLINICAL DATA:  Nausea and emesis since yesterday. Cholecystectomy. Abdominal pain, acute, generalized. EXAM: CT ABDOMEN AND PELVIS WITH CONTRAST TECHNIQUE: Multidetector CT imaging of the abdomen and pelvis was performed using the standard protocol following bolus administration of intravenous contrast. CONTRAST:  OMNIPAQUE IOHEXOL 300 MG/ML  SOLN COMPARISON:  Abdominal ultrasound  04/13/2011 FINDINGS: Lower chest: The lung bases are clear without focal nodule, mass, or airspace disease. Hepatobiliary: No focal liver abnormality is seen. Status post cholecystectomy. No biliary dilatation. Pancreas: Unremarkable. No pancreatic ductal dilatation or surrounding inflammatory changes. Spleen: Normal in size without focal abnormality. Adrenals/Urinary Tract: Adrenal glands are normal bilaterally. Kidneys and ureters are within normal limits. The urinary bladder is unremarkable. Stomach/Bowel: The stomach and duodenum are within normal limits. Small bowel is unremarkable. Terminal ileum is normal. The appendix is visualized and normal. The ascending and transverse colon are normal. Descending and sigmoid colon are within normal limits. Vascular/Lymphatic: No significant vascular findings are present. No enlarged abdominal or pelvic lymph nodes. Reproductive: Prostate is unremarkable. Other: No abdominal wall hernia or abnormality. No abdominopelvic ascites. Musculoskeletal: A left L4 pars defect is noted. Vertebral body heights and alignment are maintained. No focal lytic or blastic lesions are present. IMPRESSION: 1. No acute or focal abnormality to explain abdominal pain. 2. Cholecystectomy. 3. 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 Electronically Signed   By: Marin Roberts M.D.   On: 01/25/2019 06:46    Pending Labs Unresulted Labs (From admission, onward)    Start     Ordered   01/26/19 0500  CBC  Tomorrow morning,   R     01/25/19 0749   01/26/19 0500  Basic metabolic panel  Tomorrow morning,   R     01/25/19 0749   01/25/19 0748  TSH  Add-on,   R     01/25/19 0749   01/25/19 0747  HIV antibody (Routine Testing)  Once,   R     01/25/19 0749   01/25/19 0314  Urinalysis, Routine w reflex microscopic  ONCE - STAT,   R     01/25/19 0313   01/25/19 0314  Rapid urine drug screen (hospital performed)  ONCE - STAT,   R     01/25/19 0313          Vitals/Pain Today's Vitals    01/25/19 0545 01/25/19 0600 01/25/19 0624 01/25/19 0736  BP: 124/75 125/60  128/81  Pulse: 66 72  (!) 55  Resp: 12 17  18   Temp:      TempSrc:      SpO2: 100% 100%  100%  Weight:      Height:  PainSc:   Asleep     Isolation Precautions No active isolations  Medications Medications  enoxaparin (LOVENOX) injection 40 mg (has no administration in time range)  0.9 % NaCl with KCl 40 mEq / L  infusion (has no administration in time range)  acetaminophen (TYLENOL) tablet 650 mg (has no administration in time range)    Or  acetaminophen (TYLENOL) suppository 650 mg (has no administration in time range)  promethazine (PHENERGAN) tablet 12.5 mg (has no administration in time range)  nicotine (NICODERM CQ - dosed in mg/24 hours) patch 21 mg (has no administration in time range)  ondansetron (ZOFRAN) injection 4 mg (4 mg Intravenous Given 01/25/19 0321)  sodium chloride 0.9 % bolus 1,000 mL (0 mLs Intravenous Stopped 01/25/19 0549)  morphine 2 MG/ML injection 2 mg (2 mg Intravenous Given 01/25/19 0322)  morphine 2 MG/ML injection 2 mg (2 mg Intravenous Given 01/25/19 0548)  promethazine (PHENERGAN) injection 12.5 mg (12.5 mg Intravenous Given 01/25/19 0549)  iohexol (OMNIPAQUE) 300 MG/ML solution 100 mL (100 mLs Intravenous Contrast Given 01/25/19 0619)  lactated ringers bolus 1,000 mL (1,000 mLs Intravenous New Bag/Given 01/25/19 0737)    Mobility walks Low fall risk    R Recommendations: See Admitting Provider Note  Report given to:   Additional Notes:

## 2019-01-25 NOTE — Discharge Summary (Signed)
Name: John Skinner MRN: [id number] DOB: 08/23/1996 22 y.o. PCP: Patient, No Pcp Per  Date of Admission: 01/25/2019  3:01 AM Date of Discharge:  Attending Physician: Inez CatalinaMullen, Emily B, MD  Discharge Diagnosis: 1. Intractable nausea/vomiting with diffuse abdominal pain 2. Elevated hemoglobin/hematocrit   Discharge Medications: Allergies as of 01/25/2019   No Known Allergies     Medication List    STOP taking these medications   HYDROcodone-acetaminophen 5-325 MG tablet Commonly known as:  NORCO/VICODIN   ibuprofen 800 MG tablet Commonly known as:  ADVIL,MOTRIN     TAKE these medications   promethazine 12.5 MG tablet Commonly known as:  PHENERGAN Take 1 tablet (12.5 mg total) by mouth every 6 (six) hours as needed for nausea.     Disposition and follow-up:   [facility].  At the hospital follow up visit please address:  1. Intractable nausea/vomiting. Please continue to advised against the use of marijuana. If with cessation he continues to have recurrent episodes of nausea/vomiting and may be worth trying and antacid. Elevated hemoglobin/hematocrit. Likely the setting of dehydration. Please repeat CBC to ensure it has resolved.  2.  Labs / imaging needed at time of follow-up: CBC  3.  Pending labs/ test needing follow-up: None  Hospital Course by problem list:  Intractable nausea/vomiting with diffuse abdominal pain. John Skinner is a 23 year old male with no significant past medical history who presented to the emergency department with 48 hours of nausea/vomiting and diffuse abdominal pain. Initial labs were negative for metabolic causes of his abdominal pain. CT abdomen/pelvis with contrast was pursued that showed no acute etiology (obstruction, perforation, vascular, or inflammatory causes). His nausea/vomiting is likely secondary to his marijuana use. He was treated symptomatically with IV fluids and  antiemetics. His nausea/vomiting subsequently improved and he asked to be discharged home. He was given return precautions and our clinic phone number in case he had any questions. He was discharged in stable condition.  Elevated hemoglobin/hematocrit. Likely hemoconcentration in the setting of dehydration.   Discharge Vitals:   BP 102/62    Pulse 73    Temp 98 F (36.7 C) (Oral)    Resp (!) 23    Ht 6\' 1"  (1.854 m)    Wt 95.3 kg    SpO2 100%    BMI 27.71 kg/m   Pertinent Labs, Studies, and Procedures:  CMP     Component Value Date/Time   NA 140 01/25/2019 0311   K 3.6 01/25/2019 0311   CL 103 01/25/2019 0311   CO2 25 01/25/2019 0311   GLUCOSE 125 (H) 01/25/2019 0311   BUN 10 01/25/2019 0311   CREATININE 1.09 01/25/2019 0311   CALCIUM 9.4 01/25/2019 0311   PROT 6.6 01/25/2019 0311   ALBUMIN 4.2 01/25/2019 0311   AST 26 01/25/2019 0311   ALT 23 01/25/2019 0311   ALKPHOS 54 01/25/2019 0311   BILITOT 0.5 01/25/2019 0311   GFRNONAA >60 01/25/2019 0311   GFRAA >60 01/25/2019 0311   CT Abdomen/Pelvis with Contrast  1. No acute or focal abnormality to explain abdominal pain. 2. Cholecystectomy. 3. 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  Discharge Instructions: Discharge Instructions    Call MD for:  persistant nausea and vomiting   Complete by:  As directed    Diet - low sodium heart healthy   Complete by:  As directed    Increase activity slowly   Complete by:  As directed  Signed: Levora Dredge, MD 01/25/2019, 9:44 AM

## 2019-01-25 NOTE — H&P (Signed)
Date: 01/25/2019               Patient Name:  John Skinner MRN: [id number]  DOB: 1996-03-30 Age / Sex: 23 y.o., male   PCP: Patient, No Pcp Per         Medical Service: Internal Medicine Teaching Service         Attending Physician: Dr. Inez Catalina, MD    First Contact: Dr. Cleaster Corin Pager: [phone]  Second Contact: Dr. Crista Elliot Pager: [phone]       After Hours (After 5p/  First Contact Pager: [phone]  weekends / holidays): Second Contact Pager: [phone]   Chief Complaint: Abdominal pain  History of Present Illness: John Skinner is a 23 year old male with no significant past medical history who presented to the emergency department with 48 hours of nausea/vomiting and diffuse abdominal pain. History was obtained via the patient and chart review.  Two days ago the patient had 2-3 beers and subsequently had one episode of nonbloody loose stool, diffuse abdominal pain, and nonbloody/non-bilious emesis. He describes the pain as throbbing and diffuse, but may be worse in the epigastric region. Since that time he has had no further bowel movements but has had recurrent abdominal pain and emesis. The pain and nausea remain constant however do seem to be exacerbated by any type of PO intake including liquids. The pain does not change with movement. He has not tried any over-the-counter medications for his symptoms as he wasn't sure what would make the pain worse. He states that the nausea medicine he received in the emergency department did help. He has had similar episodes in the past, with the last one being around 23 years of age. He states with his prior episode he was drinking several beers per day and subsequently developed identical symptoms. Since that time he has significantly cut back on his alcohol intake and only drinks one time per week.  Review of systems is negative for headaches, visual changes, fevers, chills, cough, shortness of breath, chest pain, new rash, myalgias,  arthralgias, diarrhea, hematuria, dysuria.  Meds:  Patient verified that he does not take any over-the-counter or prescription medications prior to admission.  Allergies: Allergies as of 01/25/2019  . (No Known Allergies)   Past Medical History:  Diagnosis Date  . Tonsillitis    Family History: + HTN in father  Social History: Married with two children (daughter 65 years old, son three years old). 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 Has not been working much with recent COVID-19 outbreak. Uses alcohol 1 to 2 times per week. Smokes approximately one pack per day. Consumes "large volumes" of marijuana.  Review of Systems: A complete ROS was negative except as per HPI.   Physical Exam: Blood pressure 128/81, pulse (!) 55, temperature 98 F (36.7 C), temperature source Oral, resp. rate 18, height 6\' 1"  (1.854 m), weight 95.3 kg, SpO2 100 %.  General: Well nourished male in no acute distress HENT: Normocephalic, atraumatic, moist mucus membranes Pulm: Good air movement with no wheezing or crackles  CV: RRR, no murmurs, no rubs  Abdomen: Active bowel sounds, soft, non-distended, no tenderness to palpation, carnett's sign negative   Extremities: Pulses palpable in all extremities, no LE edema  Skin: Warm and dry, multiple tattoos  Neuro: Alert and oriented x 3  Assessment & Plan by Problem: Active Problems:   Intractable nausea and vomiting  John Skinner is a 23 year old male with  no significant past medical history who presented to the emergency department with 48 hours of nausea/vomiting and diffuse abdominal pain. He was unable to tolerate PO intake and subsequently admitted for further evaluation and management.  Intractable nausea/vomiting Diffuse abdominal pain - Patient presenting with 48 hours of intractable nausea/vomiting and diffuse abdominal pain. - CT abdomen/pelvis with contrast negative for acute etiology (obstruction, perforation,  vascular, or inflammatory causes) - Initial labs negative for metabolic causes of abdominal pain. Lipase elevated in the setting of vomiting. No evidence of pancreatitis on CT or clinically.  - Symptoms not consistent with functional causes of abdominal pain such as IBS. - Likely diagnosis is either gastritis vs cannabinoid hyperemesis syndrome - Bolus 1 L LR, followed by maintenance fluids - Phenergan for nausea - Start Protonix 40 mg once daily - Advised against use of marijuana and tobacco   Elevated hemoglobin/hematocrit - Likely hemoconcentration in the setting of dehydration - Repeat CBC in the morning - He is also a heavy smoker therefore, secondary polycythemia would be consideration if his CBC does not normalize after fluids.   Substance use disorder - Consumes large quantities of marijuana - Possibly causing his intractable nausea/vomiting and abdominal pain - Advised against use  Tobacco Use Disorder  - Smoke ~1PPD - Nicotine patch, 21 mg while hospitalized  - Advised against use  Diet: Regular  VTE ppx: Lovenox  CODE STATUS: Full Code  Dispo: Admit patient to Observation with expected length of stay less than 2 midnights.  Signed: Levora DredgeHelberg, John Mena, MD 01/25/2019, 7:50 AM

## 2019-01-25 NOTE — Discharge Instructions (Signed)
Thank you for allowing us to provide your care. I will send out your prescriptions. If you get to the point where you can not stop vomiting or cannot eat/drink please come back to the emergency department. You can also call our clinic at [phone] for further advice. Stop smoking cigarettes and marijuana.  Cannabinoid Hyperemesis Syndrome Cannabinoid hyperemesis syndrome (CHS) is a condition that causes repeated nausea, vomiting, and abdominal pain after long-term (chronic) use of marijuana (cannabis). People with CHS typically use marijuana 3-5 times a day for many years before they have symptoms, although it is possible to develop CHS with as little as 1 use per day. Symptoms of CHS may be mild at first but can get worse and more frequent. In some cases, CHS may cause vomiting many times a day, which can lead to weight loss and dehydration. CHS may go away and come back many times (recur). People may not have symptoms or may otherwise be healthy in between [facility]. What are the causes? The exact cause of this condition is not known. Long-term use of marijuana may over-stimulate certain proteins in the brain that react with chemicals in marijuana (cannabinoid receptors). This over-stimulation may cause CHS. What are the signs or symptoms? Symptoms of this condition are often mild during the first few attacks, but they can get worse over time. Symptoms may include:  Frequent nausea, especially early in the morning.  Vomiting.  Abdominal pain. Taking several hot showers throughout the day can also be a sign of this condition. People with CHS may do this because it relieves symptoms. How is this diagnosed? This condition may be diagnosed based on:  Your symptoms and medical history, including any drug use.  A physical exam. You may have tests done to rule out other problems. These tests may include:  Blood tests.  Urine tests.  Imaging tests, such as an X-ray or CT scan. How is  this treated? Treatment for this condition involves stopping marijuana use. Your health care provider may recommend:  A drug rehabilitation program, if you have trouble stopping marijuana use.  Medicines for nausea.  Hot showers to help relieve symptoms. Certain creams that contain a substance called capsaicin may improve symptoms when applied to the abdomen. Ask your health care provider before starting any medicines or other treatments. Severe nausea and vomiting may require you to stay at the hospital. You may need IV fluids to prevent or treat dehydration. You may also need certain medicines that must be given at the hospital. Follow these instructions at home: During an attack   Stay in bed and rest in a dark, quiet room.  Take anti-nausea medicine as told by your health care provider.  Try taking hot showers to relieve your symptoms. After an attack  Drink small amounts of clear fluids slowly. Gradually add more.  Once you are able to eat without vomiting, eat soft foods in small amounts every 3-4 hours. General instructions   Do not use any products that contain marijuana.If you need help quitting, ask your health care provider for resources and treatment options.  Drink enough fluid to keep your urine pale yellow. Avoid drinking fluids that have a lot of sugar or caffeine, such as coffee and soda.  Take and apply over-the-counter and prescription medicines only as told by your health care provider. Ask your health care provider before starting any new medicines or treatments.  Keep all follow-up visits as told by your health care provider.  This is important. Contact a health care provider if:  Your symptoms get worse.  You cannot drink fluids without vomiting.  You have pain and trouble swallowing after an attack. Get help right away if:  You cannot stop vomiting.  You have blood in your vomit or your vomit looks like coffee grounds.  You have severe abdominal  pain.  You have stools that are bloody or black, or stools that look like tar.  You have symptoms of dehydration, such as: ? Sunken eyes. ? Inability to make tears. ? Cracked lips. ? Dry mouth. ? Decreased urine production. ? Weakness. ? Sleepiness. ? Fainting. Summary  Cannabinoid hyperemesis syndrome (CHS) is a condition that causes repeated nausea, vomiting, and abdominal pain after long-term use of marijuana.  People with CHS typically use marijuana 3-5 times a day for many years before they have symptoms, although it is possible to develop CHS with as little as 1 use per day.  Treatment for this condition involves stopping marijuana use. Hot showers and capsaicin creams may also help relieve symptoms. Ask your health care provider before starting any medicines or other treatments.  Your health care provider may prescribe medicines to help with nausea.  Get help right away if you have signs of dehydration, such as dry mouth, decreased urine production, or weakness. This information is not intended to replace advice given to you by your health care provider. Make sure you discuss any questions you have with your health care provider. Document Released: 01/08/2017 Document Revised: 01/08/2017 Document Reviewed: 01/08/2017 Elsevier Interactive Patient Education  2019 [employer].

## 2019-01-25 NOTE — ED Notes (Signed)
Admitting reports coming to see patient within the next couple hours to assess if he is appropriate to go home. Requesting patient stay in ED.

## 2019-01-25 NOTE — ED Notes (Signed)
Pt given Ginger ale and Water for PO challenge.

## 2019-01-25 NOTE — ED Notes (Signed)
Patient transported to X-ray 

## 2019-01-25 NOTE — ED Notes (Signed)
Patient requesting to leave hospital with prescriptions. Admitting paged.

## 2019-01-25 NOTE — Progress Notes (Signed)
Attempt to reach RN for report she's on her way to Cath Lab. She will call when return.

## 2019-01-25 NOTE — ED Triage Notes (Signed)
BIB [facility] from home with c/o of emesis since yesterday. Hx of GI issues.

## 2019-01-25 NOTE — ED Provider Notes (Signed)
[facility] EMERGENCY DEPARTMENT Provider Note   CSN: [id number] Arrival date & time: 01/25/19  0301    History   Chief Complaint Chief Complaint  Patient presents with  . Emesis    HPI John Skinner is a 23 y.o. male with a hx of cholecystectomy presents to the Emergency Department complaining of gradual, persistent, progressively worsening generalized abd pain onset several days ago, but he cannot remember how many days ago.  He reports the pain is rated at a 10/10.  He is unable to describe the quality of the pain.  Patient has associated vomiting.  He reports he has been vomiting for the last 2 nights but has not been vomiting during the day.  Emesis is nonbloody nonbilious.  Patient reports no bowel movements for the last 3 days but he is passing gas.  He reports that he had several loose stools the day of his last bowel movement.  He denies melena or hematochezia at that time.  Patient reports that he smokes a lot of cigarettes and smokes a large volume of marijuana daily since the age of 13.  He denies routine alcohol usage.  He denies known sick contacts, fever, chills, headache, neck pain, chest pain, shortness of breath, weakness, dizziness, syncope.  No treatments prior to arrival.    The history is provided by the patient and medical records. No language interpreter was used.    Past Medical History:  Diagnosis Date  . Tonsillitis     There are no active problems to display for this patient.   Past Surgical History:  Procedure Laterality Date  . CHOLECYSTECTOMY          Home Medications    Prior to Admission medications   Medication Sig Start Date End Date Taking? Authorizing Provider  HYDROcodone-acetaminophen (NORCO/VICODIN) 5-325 MG tablet Take 1 tablet by mouth every 4 (four) hours as needed for severe pain. Patient not taking: Reported on 01/25/2019 05/06/16   Trixie Dredge, PA-C  ibuprofen (ADVIL,MOTRIN) 800 MG tablet Take 1 tablet (800 mg  total) by mouth every 8 (eight) hours as needed for mild pain or moderate pain. Patient not taking: Reported on 01/25/2019 05/06/16   Trixie Dredge, PA-C    Family History Family History  Problem Relation Age of Onset  . Hypertension Father     Social History Social History   Tobacco Use  . Smoking status: Current Every Day Smoker    Packs/day: 1.00  . Smokeless tobacco: Never Used  Substance Use Topics  . Alcohol use: Yes    Comment: social  . Drug use: Yes    Types: Marijuana    Comment: States he smokes a lot of Marijuana since he was 11     Allergies   Patient has no known allergies.   Review of Systems Review of Systems  Constitutional: Negative for appetite change, diaphoresis, fatigue, fever and unexpected weight change.  HENT: Negative for mouth sores.   Eyes: Negative for visual disturbance.  Respiratory: Negative for cough, chest tightness, shortness of breath and wheezing.   Cardiovascular: Negative for chest pain.  Gastrointestinal: Positive for abdominal pain, diarrhea, nausea and vomiting. Negative for constipation.  Endocrine: Negative for polydipsia, polyphagia and polyuria.  Genitourinary: Negative for dysuria, frequency, hematuria and urgency.  Musculoskeletal: Negative for back pain and neck stiffness.  Skin: Negative for rash.  Allergic/Immunologic: Negative for immunocompromised state.  Neurological: Negative for syncope, light-headedness and headaches.  Hematological: Does not bruise/bleed easily.  Psychiatric/Behavioral: Negative for  sleep disturbance. The patient is not nervous/anxious.      Physical Exam Updated Vital Signs BP 125/60   Pulse 72   Temp 98 F (36.7 C) (Oral)   Resp 17   Ht  (1.854 m)   Wt 95.3 kg   SpO2 100%   BMI 27.71 kg/m    Physical Exam Vitals signs and nursing note reviewed.  Constitutional:      General: He is not in acute distress.    Appearance: He is well-developed. He is not diaphoretic.      Comments: Awake, alert, nontoxic appearance Uncomfortable appearing, crying  HENT:     Head: Normocephalic and atraumatic.     Mouth/Throat:     Pharynx: No oropharyngeal exudate.  Eyes:     General: No scleral icterus.    Conjunctiva/sclera: Conjunctivae normal.  Neck:     Musculoskeletal: Normal range of motion and neck supple.  Cardiovascular:     Rate and Rhythm: Regular rhythm. Tachycardia present.     Comments: Mild tachycardia Pulmonary:     Effort: Pulmonary effort is normal. No respiratory distress.     Breath sounds: Normal breath sounds. No wheezing.  Abdominal:     General: Bowel sounds are normal.     Palpations: Abdomen is soft. There is no mass.     Tenderness: There is generalized abdominal tenderness. There is no right CVA tenderness, left CVA tenderness, guarding or rebound.  Musculoskeletal: Normal range of motion.  Skin:    General: Skin is warm and dry.  Neurological:     Mental Status: He is alert.     Comments: Speech is clear and goal oriented Moves extremities without ataxia      ED Treatments / Results  Labs (all labs ordered are listed, but only abnormal results are displayed) Labs Reviewed  CBC WITH DIFFERENTIAL/PLATELET - Abnormal; Notable for the following components:      Result Value   WBC 12.6 (*)    RBC 5.88 (*)    Hemoglobin 17.1 (*)    HCT 52.7 (*)    Neutro Abs 8.9 (*)    All other components within normal limits  COMPREHENSIVE METABOLIC PANEL - Abnormal; Notable for the following components:   Glucose, Bld 125 (*)    All other components within normal limits  LIPASE, BLOOD - Abnormal; Notable for the following components:   Lipase 107 (*)    All other components within normal limits  URINALYSIS, ROUTINE W REFLEX MICROSCOPIC  RAPID URINE DRUG SCREEN, HOSP PERFORMED    Radiology Ct Abdomen Pelvis W Contrast  Result Date: 01/25/2019 CLINICAL DATA:  Nausea and emesis since yesterday. Cholecystectomy. Abdominal pain, acute,  generalized. EXAM: CT ABDOMEN AND PELVIS WITH CONTRAST TECHNIQUE: Multidetector CT imaging of the abdomen and pelvis was performed using the standard protocol following bolus administration of intravenous contrast. CONTRAST:  OMNIPAQUE IOHEXOL 300 MG/ML  SOLN COMPARISON:  Abdominal ultrasound 04/13/2011 FINDINGS: Lower chest: The lung bases are clear without focal nodule, mass, or airspace disease. Hepatobiliary: No focal liver abnormality is seen. Status post cholecystectomy. No biliary dilatation. Pancreas: Unremarkable. No pancreatic ductal dilatation or surrounding inflammatory changes. Spleen: Normal in size without focal abnormality. Adrenals/Urinary Tract: Adrenal glands are normal bilaterally. Kidneys and ureters are within normal limits. The urinary bladder is unremarkable. Stomach/Bowel: The stomach and duodenum are within normal limits. Small bowel is unremarkable. Terminal ileum is normal. The appendix is visualized and normal. The ascending and transverse colon are normal. Descending and sigmoid  colon are within normal limits. Vascular/Lymphatic: No significant vascular findings are present. No enlarged abdominal or pelvic lymph nodes. Reproductive: Prostate is unremarkable. Other: No abdominal wall hernia or abnormality. No abdominopelvic ascites. Musculoskeletal: A left L4 pars defect is noted. Vertebral body heights and alignment are maintained. No focal lytic or blastic lesions are present. IMPRESSION: 1. No acute or focal abnormality to explain abdominal pain. 2. Cholecystectomy. 3. 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 Electronically Signed   By: Marin Roberts M.D.   On: 01/25/2019 06:46    Procedures Procedures (including critical care time)  Medications Ordered in ED Medications  capsaicin (ZOSTRIX) 0.025 % cream ( Topical Given 01/25/19 0343)  lactated ringers bolus 1,000 mL (has no administration in time range)  ondansetron (ZOFRAN) injection 4 mg (4 mg Intravenous Given  01/25/19 0321)  sodium chloride 0.9 % bolus 1,000 mL (0 mLs Intravenous Stopped 01/25/19 0549)  morphine 2 MG/ML injection 2 mg (2 mg Intravenous Given 01/25/19 0322)  morphine 2 MG/ML injection 2 mg (2 mg Intravenous Given 01/25/19 0548)  promethazine (PHENERGAN) injection 12.5 mg (12.5 mg Intravenous Given 01/25/19 0549)  iohexol (OMNIPAQUE) 300 MG/ML solution 100 mL (100 mLs Intravenous Contrast Given 01/25/19 2956)     Initial Impression / Assessment and Plan / ED Course  I have reviewed the triage vital signs and the nursing notes.  Pertinent labs & imaging results that were available during my care of the patient were reviewed by me and considered in my medical decision making (see chart for details).  Clinical Course as of Jan 25 716  Mon Jan 25, 2019  2130 Pt reports complete resolution of pain.  No additional episodes of emesis.  Abd is soft and nontender.   [HM]  0536 Pt now complaining of worsening pain and nausea.  Will redose medication and obtain CT   [HM]  0714 CT scan without acute abnormality.  No evidence of necrotic pancreas.  Emergent continues to have abdominal pain and vomiting here in the emergency department.  Will admit.  CT ABDOMEN PELVIS W CONTRAST [HM]    Clinical Course User Index [HM] Raimundo Corbit, Boyd Kerbs        John Skinner was evaluated in Emergency Department on 01/25/2019 for the symptoms described in the history of present illness. He was evaluated in the context of the global COVID-19 pandemic, which necessitated consideration that the patient might be at risk for infection with the SARS-CoV-2 virus that causes COVID-19. Institutional protocols and algorithms that pertain to the evaluation of patients at risk for COVID-19 are in a state of rapid change based on information released by regulatory bodies including the CDC and federal and state organizations. These policies and algorithms were followed during the patient's care in the ED.  Pt presents  with abdominal pain.  Suspect cannabis induced hyperemesis vs pancreatitis.  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  Abd is soft and nontender, but pain and emesis have been uncontrollable.  Elevated lipase, but labs are otherwise reassuring.  No previous abdominal imaging.  Will obtain CT and admit for intractable vomiting.    Discussed with Internal medicine who will admit.      Final Clinical Impressions(s) / ED Diagnoses   Final diagnoses:  Intractable vomiting with nausea, unspecified vomiting type  Marijuana abuse  Generalized abdominal pain    ED Discharge Orders    None       Milta Deiters 01/25/19 8657  Gilda CreasePollina, Christopher J, MD 01/25/19 [id number]

## 2019-01-25 NOTE — ED Notes (Signed)
Pt states drinking fluids is making him nauseated. No emesis noted. EDP notified and orders placed.

## 2019-01-26 ENCOUNTER — Other Ambulatory Visit: Payer: Self-pay

## 2019-01-26 ENCOUNTER — Emergency Department (HOSPITAL_COMMUNITY)
Admission: EM | Admit: 2019-01-26 | Discharge: 2019-01-26 | Discharge status: Against Medical Advice | Payer: Medicaid Other | Attending: Emergency Medicine | Admitting: Emergency Medicine

## 2019-01-26 DIAGNOSIS — Z5320 Procedure and treatment not carried out because of patient's decision for unspecified reasons: Secondary | ICD-10-CM | POA: Insufficient documentation

## 2019-01-26 DIAGNOSIS — R1084 Generalized abdominal pain: Secondary | ICD-10-CM | POA: Diagnosis present

## 2019-01-26 DIAGNOSIS — F17210 Nicotine dependence, cigarettes, uncomplicated: Secondary | ICD-10-CM | POA: Diagnosis not present

## 2019-01-26 DIAGNOSIS — R112 Nausea with vomiting, unspecified: Secondary | ICD-10-CM | POA: Diagnosis not present

## 2019-01-26 LAB — COMPREHENSIVE METABOLIC PANEL
ALT: 24 U/L (ref 0–44)
AST: 29 U/L (ref 15–41)
Albumin: 4.3 g/dL (ref 3.5–5.0)
Alkaline Phosphatase: 51 U/L (ref 38–126)
Anion gap: 14 (ref 5–15)
BUN: 9 mg/dL (ref 6–20)
CO2: 22 mmol/L (ref 22–32)
Calcium: 9.3 mg/dL (ref 8.9–10.3)
Chloride: 103 mmol/L (ref 98–111)
Creatinine, Ser: 1.17 mg/dL (ref 0.61–1.24)
GFR calc Af Amer: >60 mL/min (ref >60)
GFR calc non Af Amer: >60 mL/min (ref >60)
Glucose, Bld: 126 mg/dL — ABNORMAL HIGH (ref 70–99)
Potassium: 3.9 mmol/L (ref 3.5–5.1)
Sodium: 139 mmol/L (ref 135–145)
Total Bilirubin: 1.1 mg/dL (ref 0.3–1.2)
Total Protein: 7 g/dL (ref 6.5–8.1)

## 2019-01-26 LAB — LIPASE, BLOOD: Lipase: 25 U/L (ref 11–51)

## 2019-01-26 LAB — CBC WITH DIFFERENTIAL/PLATELET
Abs Immature Granulocytes: 0.05 10*3/uL (ref 0.00–0.07)
Basophils Absolute: 0.1 10*3/uL (ref 0.0–0.1)
Basophils Relative: 0 %
Eosinophils Absolute: 0 10*3/uL (ref 0.0–0.5)
Eosinophils Relative: 0 %
HCT: 51.9 % (ref 39.0–52.0)
Hemoglobin: 16.6 g/dL (ref 13.0–17.0)
Immature Granulocytes: 0 %
Lymphocytes Relative: 10 %
Lymphs Abs: 1.6 10*3/uL (ref 0.7–4.0)
MCH: 28.5 pg (ref 26.0–34.0)
MCHC: 32 g/dL (ref 30.0–36.0)
MCV: 89.2 fL (ref 80.0–100.0)
Monocytes Absolute: 0.8 10*3/uL (ref 0.1–1.0)
Monocytes Relative: 5 %
Neutro Abs: 13.3 10*3/uL — ABNORMAL HIGH (ref 1.7–7.7)
Neutrophils Relative %: 85 %
Platelets: 228 10*3/uL (ref 150–400)
RBC: 5.82 MIL/uL — ABNORMAL HIGH (ref 4.22–5.81)
RDW: 12.7 % (ref 11.5–15.5)
WBC: 15.8 10*3/uL — ABNORMAL HIGH (ref 4.0–10.5)
nRBC: 0 % (ref 0.0–0.2)

## 2019-01-26 MED ORDER — ONDANSETRON HCL 4 MG/2ML IJ SOLN
4.0000 mg | Freq: Once | INTRAMUSCULAR | Status: AC
  Administered 2019-01-26: 08:00:00 4 mg via INTRAVENOUS
  Filled 2019-01-26: qty 2

## 2019-01-26 MED ORDER — MORPHINE SULFATE (PF) 4 MG/ML IV SOLN
4.0000 mg | Freq: Once | INTRAVENOUS | Status: AC
  Administered 2019-01-26: 4 mg via INTRAVENOUS
  Filled 2019-01-26: qty 1

## 2019-01-26 MED ORDER — SODIUM CHLORIDE 0.9 % IV BOLUS
1000.0000 mL | Freq: Once | INTRAVENOUS | Status: AC
  Administered 2019-01-26: 1000 mL via INTRAVENOUS

## 2019-01-26 NOTE — ED Notes (Signed)
Pt is repeatedly calling out, yelling profanities at this NT about staff.

## 2019-01-26 NOTE — ED Notes (Signed)
EDP at bedside  

## 2019-01-26 NOTE — ED Provider Notes (Signed)
[facility]GENC[facility]PART[facility] Provider Note   CSN: [id number] Arrival date & time: 01/26/19  0732    History   Chief Complaint No chief complaint on file.   HPI John Skinner is a 23 y.o. male with PSHx cholecystectomy who presents to the ED complaining of diffuse abdominal pain x 3 days with nausea, vomiting, and diarrhea. He reports TNTC episodes of both nonbloody nonbilious emesis and diarrhea. He was seen in the ED on 04/13 for same; labs with leukocytosis of 12.6, elevated Hgb 17.1 likely due to hemoconcentration, mildly elevated lipase at 107, and positive UDS for THC. CT A/P negative. Pt's symptoms were unable to be controlled in the ED and he was admitted for intractable vomiting. Pt decided to leave AMA and was prescribed Phenergan by admitting team for his symptoms although he reports he was unable to pick up the prescription and has continued to vomit since leaving, prompting his return today. Denies any worsening symptoms. No fever or chills.        Past Medical History:  Diagnosis Date   Tonsillitis     Patient Active Problem List   Diagnosis Date Noted   Intractable nausea and vomiting 01/25/2019   Intractable vomiting    Marijuana abuse     Past Surgical History:  Procedure Laterality Date   CHOLECYSTECTOMY          Home Medications    Prior to Admission medications   Medication Sig Start Date End Date Taking? Authorizing Provider  promethazine (PHENERGAN) 12.5 MG tablet Take 1 tablet (12.5 mg total) by mouth every 6 (six) hours as needed for nausea. 01/25/19   Levora DredgeHelberg, Justin, MD    Family History Family History  Problem Relation Age of Onset   Hypertension Father     Social History Social History   Tobacco Use   Smoking status: Current Every Day Smoker    Packs/day: 1.00   Smokeless tobacco: Never Used  Substance Use Topics   Alcohol use: Yes    Comment: social   Drug use: Yes    Types: Marijuana   Comment: States he smokes a lot of Marijuana since he was 11     Allergies   Patient has no known allergies.   Review of Systems Review of Systems  Constitutional: Negative for chills and fever.  HENT: Negative for congestion.   Eyes: Negative for redness.  Respiratory: Negative for cough and shortness of breath.   Cardiovascular: Negative for chest pain.  Gastrointestinal: Positive for abdominal pain, diarrhea, nausea and vomiting. Negative for blood in stool and constipation.  Genitourinary: Negative for dysuria and frequency.  Musculoskeletal: Negative for myalgias.  Skin: Negative for rash.  Neurological: Negative for speech difficulty.     Physical Exam Updated Vital Signs BP (!) 150/94 (BP Location: Right Arm)    Pulse 70    Temp 97.7 F (36.5 C)    Resp (!) 22    SpO2 99%   Physical Exam Vitals signs and nursing note reviewed.  Constitutional:      Comments: Pt tearful on exam; actively vomiting in room  HENT:     Head: Normocephalic and atraumatic.  Eyes:     General: No scleral icterus.    Conjunctiva/sclera: Conjunctivae normal.  Neck:     Musculoskeletal: Neck supple.  Cardiovascular:     Rate and Rhythm: Normal rate and regular rhythm.  Pulmonary:     Effort: Pulmonary effort is normal.     Breath sounds: Normal  breath sounds. No wheezing, rhonchi or rales.  Abdominal:     General: Abdomen is flat. Bowel sounds are normal. There is no distension.     Palpations: Abdomen is soft.     Tenderness: There is abdominal tenderness. There is no right CVA tenderness or left CVA tenderness.     Comments: Mild diffuse tenderness to palpation; no rebound or guarding  Skin:    General: Skin is warm and dry.  Neurological:     Mental Status: He is alert.      ED Treatments / Results  Labs (all labs ordered are listed, but only abnormal results are displayed) Labs Reviewed  CBC WITH DIFFERENTIAL/PLATELET - Abnormal; Notable for the following components:       Result Value   WBC 15.8 (*)    RBC 5.82 (*)    Neutro Abs 13.3 (*)    All other components within normal limits  COMPREHENSIVE METABOLIC PANEL  LIPASE, BLOOD  URINALYSIS, ROUTINE W REFLEX MICROSCOPIC    EKG None  Radiology Ct Abdomen Pelvis W Contrast  Result Date: 01/25/2019 CLINICAL DATA:  Nausea and emesis since yesterday. Cholecystectomy. Abdominal pain, acute, generalized. EXAM: CT ABDOMEN AND PELVIS WITH CONTRAST TECHNIQUE: Multidetector CT imaging of the abdomen and pelvis was performed using the standard protocol following bolus administration of intravenous contrast. CONTRAST:  OMNIPAQUE IOHEXOL 300 MG/ML  SOLN COMPARISON:  Abdominal ultrasound 04/13/2011 FINDINGS: Lower chest: The lung bases are clear without focal nodule, mass, or airspace disease. Hepatobiliary: No focal liver abnormality is seen. Status post cholecystectomy. No biliary dilatation. Pancreas: Unremarkable. No pancreatic ductal dilatation or surrounding inflammatory changes. Spleen: Normal in size without focal abnormality. Adrenals/Urinary Tract: Adrenal glands are normal bilaterally. Kidneys and ureters are within normal limits. The urinary bladder is unremarkable. Stomach/Bowel: The stomach and duodenum are within normal limits. Small bowel is unremarkable. Terminal ileum is normal. The appendix is visualized and normal. The ascending and transverse colon are normal. Descending and sigmoid colon are within normal limits. Vascular/Lymphatic: No significant vascular findings are present. No enlarged abdominal or pelvic lymph nodes. Reproductive: Prostate is unremarkable. Other: No abdominal wall hernia or abnormality. No abdominopelvic ascites. Musculoskeletal: A left L4 pars defect is noted. Vertebral body heights and alignment are maintained. No focal lytic or blastic lesions are present. IMPRESSION: 1. No acute or focal abnormality to explain abdominal pain. 2. Cholecystectomy. 3. Left L4 pars defect without  listhesis. Electronically Signed   By: Marin Roberts M.D.   On: 01/25/2019 06:46    Procedures Procedures (including critical care time)  Medications Ordered in ED Medications  ondansetron (ZOFRAN) injection 4 mg (4 mg Intravenous Given 01/26/19 0813)  morphine 4 MG/ML injection 4 mg (4 mg Intravenous Given 01/26/19 0813)  sodium chloride 0.9 % bolus 1,000 mL (0 mLs Intravenous Stopped 01/26/19 0846)     Initial Impression / Assessment and Plan / ED Course  I have reviewed the triage vital signs and the nursing notes.  Pertinent labs & imaging results that were available during my care of the patient were reviewed by me and considered in my medical decision making (see chart for details).     Pt presents to the ED with generalized abdominal pain, nausea, vomiting, and diarrhea. Previously seen on 04/13 for same; admitted for intractable vomiting but left AMA. Continues with same as he was unable to pick up Phenergan prescription; actively vomiting in the room and in obvious pain although only mild TTP diffusely. Denies worsening pain. Pt is  very short with provider and will not answer many questions until he gets pain and antiemetic medication. Will order pain medication and antiemetics at this time. Will repeat blood work and give 1 L NS and reevaluate once labs return. Do not feel pt needs repeat CT at this time given unchanged symptoms. If unable to get pt's symptoms under control will call for admission.   Pt continues to be verbally abusive to nursing staff. Care was assumed by Burna Forts, PA-C who discussed with patient that if he continues with this behavior he will be escorted off the property (see note by Trey Paula).        Final Clinical Impressions(s) / ED Diagnoses   Final diagnoses:  Non-intractable vomiting with nausea, unspecified vomiting type    ED Discharge Orders    None       Tanda Rockers, PA-C 01/26/19 0856    Sabas Sous, MD 01/26/19 [phone]

## 2019-01-26 NOTE — ED Notes (Signed)
Pt repeatedly calling out, yelling, threatening staff.  This RN made multiple attempts to deescalate the situation.

## 2019-01-26 NOTE — ED Notes (Signed)
Security at bedside

## 2019-01-26 NOTE — ED Notes (Signed)
Patient verbalizes understanding the risk of leaving AMA Pt still screaming, yelling, and making threats.  Security escorted pt off property.

## 2019-01-26 NOTE — ED Triage Notes (Signed)
Pt here with c/o abdominal pain and N/V.  No current temperature.  Pt seen here yesterday for same and was advised to stay, decided to leave AMA.  Pt states he was unable to get his antiemetic prescription filled last PM.

## 2019-01-26 NOTE — ED Notes (Signed)
ED Provider at bedside. 

## 2019-01-26 NOTE — ED Notes (Signed)
Pt still screaming, yelling, and making threats against staff.   PA aware.

## 2019-01-26 NOTE — ED Provider Notes (Signed)
  22 YOM in the ED with abdominal pain.  Called in the patient room by nursing staff this patient has been verbally abusing staff aggressive and yelling.  I attempted to talk with the patient he was again verbally abusive yelling at me requesting that he leave.  Patient requesting that his IV be taken out so he can leave..  Security was called as he is cussing and yelling.   Patient was originally seen yesterday with intractable nausea and vomiting he had a CT scan that showed no acute life-threatening etiology at that time.  Patient was admitted to the hospital patient's care was initially started by previous provider but given patient's aggressive nature I assumed care for the patient.  I have reviewed his chart I do not feel that he has an acute life-threatening etiology at this time.  He is afebrile with no tachycardia.  Throughout my entire conversation with him he did not appear to be in any acute distress he had no vomiting he is requesting to leave but will not leave and continues arguing with security and staff.  I do not feel he is safe for ongoing evaluation and management in the emergent setting.  I feel that the safest option for staff at this time is for patient to be released from the hospital.  I discussed with the patient that he is free to return but his behavior will not be tolerated as we do not feel safe for our staff at this time.  Vitals:   01/26/19 0739 01/26/19 0745  BP: (!) 150/94 139/75  Pulse: 70   Resp: (!) 22   Temp: 97.7 F (36.5 C)   SpO2: 99%        Eyvonne Mechanic, PA-C 01/26/19 0854    Sabas Sous, MD 01/27/19 [phone]

## 2019-02-12 ENCOUNTER — Ambulatory Visit (HOSPITAL_COMMUNITY)
Admission: EM | Admit: 2019-02-12 | Discharge: 2019-02-12 | Disposition: A | Discharge status: Home / Self Care | Payer: Medicaid Other | Attending: Family Medicine | Admitting: Family Medicine

## 2019-02-12 ENCOUNTER — Encounter (HOSPITAL_COMMUNITY): Payer: Self-pay | Admitting: Emergency Medicine

## 2019-02-12 ENCOUNTER — Other Ambulatory Visit: Payer: Self-pay

## 2019-02-12 DIAGNOSIS — Z202 Contact with and (suspected) exposure to infections with a predominantly sexual mode of transmission: Secondary | ICD-10-CM | POA: Insufficient documentation

## 2019-02-12 MED ORDER — AZITHROMYCIN 250 MG PO TABS
1000.0000 mg | ORAL_TABLET | Freq: Once | ORAL | Status: AC
  Administered 2019-02-12: 1000 mg via ORAL

## 2019-02-12 MED ORDER — AZITHROMYCIN 250 MG PO TABS
ORAL_TABLET | ORAL | Status: AC
  Filled 2019-02-12: qty 4

## 2019-02-12 NOTE — ED Triage Notes (Signed)
Patient reports his wife has chlamydia.  Patient denies pain, denies discharge

## 2019-02-12 NOTE — Discharge Instructions (Signed)
We have treated you today for chlamydia, with azithromycin. Please refrain from sexual activity for 7 days while medicine is clearing infection. ° °We are testing you for Gonorrhea, Chlamydia and Trichomonas. We will call you if anything is positive and let you know if you require any further treatment. Please inform partner of any positive results. ° °Please return if symptoms not improving with treatment, development of fever, nausea, vomiting, abdominal pain, scrotal pain. °

## 2019-02-12 NOTE — ED Provider Notes (Signed)
[facility]    CSN: [id number] Arrival date & time: 02/12/19  1730     History   Chief Complaint Chief Complaint  Patient presents with  . SEXUALLY TRANSMITTED DISEASE    HPI John Skinner is a 23 y.o. male no significant past medical history presenting today for STD screening.  Patient states that his wife has tested positive for chlamydia which is prompting his evaluation today.  He notes that he has had occasional dysuria, but no persistent symptoms.  Denies penile discharge.  Denies any rashes or lesions.  States that chlamydia was the only STD which she tested positive for which she is being treated.  Denies any abdominal pain nausea or vomiting.  Denies testicular pain or swelling.  HPI  Past Medical History:  Diagnosis Date  . Tonsillitis     Patient Active Problem List   Diagnosis Date Noted  . Intractable nausea and vomiting 01/25/2019  . Intractable vomiting   . Marijuana abuse     Past Surgical History:  Procedure Laterality Date  . CHOLECYSTECTOMY         Home Medications    Prior to Admission medications   Not on File    Family History Family History  Problem Relation Age of Onset  . Hypertension Father     Social History Social History   Tobacco Use  . Smoking status: Current Every Day Smoker    Packs/day: 1.00  . Smokeless tobacco: Never Used  Substance Use Topics  . Alcohol use: Yes    Comment: social  . Drug use: Yes    Types: Marijuana    Comment: States he smokes a lot of Marijuana since he was 11     Allergies   Patient has no known allergies.   Review of Systems Review of Systems  Constitutional: Negative for fever.  HENT: Negative for sore throat.   Respiratory: Negative for shortness of breath.   Cardiovascular: Negative for chest pain.  Gastrointestinal: Negative for abdominal pain, nausea and vomiting.  Genitourinary: Positive for dysuria. Negative for difficulty urinating, discharge, frequency, penile  pain, penile swelling, scrotal swelling and testicular pain.  Skin: Negative for rash.  Neurological: Negative for dizziness, light-headedness and headaches.     Physical Exam Triage Vital Signs ED Triage Vitals  Enc Vitals Group     BP 02/12/19 1742 124/65     Pulse Rate 02/12/19 1742 100     Resp 02/12/19 1742 18     Temp 02/12/19 1742 98.2 F (36.8 C)     Temp Source 02/12/19 1742 Oral     SpO2 02/12/19 1742 100 %     Weight --      Height --      Head Circumference --      Peak Flow --      Pain Score 02/12/19 1741 0     Pain Loc --      Pain Edu? --      Excl. in GC? --    No data found.  Updated Vital Signs BP 124/65 (BP Location: Left Arm)   Pulse 100   Temp 98.2 F (36.8 C) (Oral)   Resp 18   SpO2 100%   Visual Acuity Right Eye Distance:   Left Eye Distance:   Bilateral Distance:    Right Eye Near:   Left Eye Near:    Bilateral Near:     Physical Exam Vitals signs and nursing note reviewed.  Constitutional:  Appearance: He is well-developed.     Comments: No acute distress  HENT:     Head: Normocephalic and atraumatic.     Nose: Nose normal.  Eyes:     Conjunctiva/sclera: Conjunctivae normal.  Neck:     Musculoskeletal: Neck supple.  Cardiovascular:     Rate and Rhythm: Normal rate.  Pulmonary:     Effort: Pulmonary effort is normal. No respiratory distress.  Abdominal:     General: There is no distension.     Comments: Abdomen soft, nondistended, nontender to light deep palpation throughout abdomen  Musculoskeletal: Normal range of motion.  Skin:    General: Skin is warm and dry.  Neurological:     Mental Status: He is alert and oriented to person, place, and time.      UC Treatments / Results  Labs (all labs ordered are listed, but only abnormal results are displayed) Labs Reviewed  URINE CYTOLOGY ANCILLARY ONLY    EKG None  Radiology No results found.  Procedures Procedures (including critical care time)   Medications Ordered in UC Medications  azithromycin (ZITHROMAX) tablet 1,000 mg (1,000 mg Oral Given 02/12/19 1754)    Initial Impression / Assessment and Plan / UC Course  I have reviewed the triage vital signs and the nursing notes.  Pertinent labs & imaging results that were available during my care of the patient were reviewed by me and considered in my medical decision making (see chart for details).     Patient with known exposure to chlamydia, will empirically treat today with 1 g azithromycin.  Will defer treatment for other STDs until results of urine cytology obtained.  Will call patient with results and provide further treatment if needed.Discussed strict return precautions. Patient verbalized understanding and is agreeable with plan.  Final Clinical Impressions(s) / UC Diagnoses   Final diagnoses:  Exposure to STD     Discharge Instructions     We have treated you today for chlamydia, with azithromycin. Please refrain from sexual activity for 7 days while medicine is clearing infection.  We are testing you for Gonorrhea, Chlamydia and Trichomonas. We will call you if anything is positive and let you know if you require any further treatment. Please inform partner of any positive results.  Please return if symptoms not improving with treatment, development of fever, nausea, vomiting, abdominal pain, scrotal pain.   ED Prescriptions    None     Controlled Substance Prescriptions [place] Controlled Substance Registry consulted? Not Applicable   Lew DawesWieters, Karolee Meloni C, [place]PA-C 02/12/19 2107

## 2019-02-15 ENCOUNTER — Telehealth (HOSPITAL_COMMUNITY): Payer: Self-pay | Admitting: Emergency Medicine

## 2019-02-15 LAB — URINE CYTOLOGY ANCILLARY ONLY
Chlamydia: POSITIVE — AB
Neisseria Gonorrhea: NEGATIVE
Trichomonas: NEGATIVE

## 2019-02-15 NOTE — Telephone Encounter (Signed)
Chlamydia is positive.  This was treated at the urgent care visit with po zithromax 1g.  Pt needs education to please refrain from sexual intercourse for 7 days to give the medicine time to work.  Sexual partners need to be notified and tested/treated.  Condoms may reduce risk of reinfection.  Recheck or followup with PCP for further evaluation if symptoms are not improving.  [facility] notified.  Patient contacted and made aware of all results, all questions answered.   

## 2019-05-27 ENCOUNTER — Encounter (HOSPITAL_COMMUNITY): Payer: Self-pay

## 2019-05-27 ENCOUNTER — Other Ambulatory Visit: Payer: Self-pay

## 2019-05-27 ENCOUNTER — Ambulatory Visit (HOSPITAL_COMMUNITY)
Admission: EM | Admit: 2019-05-27 | Discharge: 2019-05-27 | Disposition: A | Discharge status: Home / Self Care | Payer: Medicaid Other | Attending: Family Medicine | Admitting: Family Medicine

## 2019-05-27 DIAGNOSIS — Z202 Contact with and (suspected) exposure to infections with a predominantly sexual mode of transmission: Secondary | ICD-10-CM

## 2019-05-27 MED ORDER — AZITHROMYCIN 250 MG PO TABS
ORAL_TABLET | ORAL | Status: AC
  Filled 2019-05-27: qty 4

## 2019-05-27 MED ORDER — AZITHROMYCIN 250 MG PO TABS
1000.0000 mg | ORAL_TABLET | Freq: Once | ORAL | Status: AC
  Administered 2019-05-27: 1000 mg via ORAL

## 2019-05-27 MED ORDER — CEFTRIAXONE SODIUM 250 MG IJ SOLR
250.0000 mg | Freq: Once | INTRAMUSCULAR | Status: AC
  Administered 2019-05-27: 250 mg via INTRAMUSCULAR

## 2019-05-27 MED ORDER — CEFTRIAXONE SODIUM 250 MG IJ SOLR
INTRAMUSCULAR | Status: AC
  Filled 2019-05-27: qty 250

## 2019-05-27 NOTE — Discharge Instructions (Signed)
We are treating you with medicine protocol recommended by the health department and the CDC Avoid sexual relations for 7 days

## 2019-05-27 NOTE — ED Provider Notes (Signed)
[facility]    CSN: [id number] Arrival date & time: 05/27/19  1123      History   Chief Complaint Chief Complaint  Patient presents with  . SEXUALLY TRANSMITTED DISEASE    HPI John Skinner is a 23 y.o. male.   HPI  Patient is here requesting treatment for chlamydia.  Does not want testing.  Does not want any other treatment.  He states he has been exposed to chlamydia, and wants treatment for that.  He has been notified by his sexual partner.  States this is his only sexual partner.  States this is the second time she is told him she had chlamydia, and insists that it will be the last.  Past Medical History:  Diagnosis Date  . Tonsillitis     Patient Active Problem List   Diagnosis Date Noted  . Intractable nausea and vomiting 01/25/2019  . Intractable vomiting   . Marijuana abuse     Past Surgical History:  Procedure Laterality Date  . CHOLECYSTECTOMY         Home Medications    Prior to Admission medications   Not on File    Family History Family History  Problem Relation Age of Onset  . Hypertension Father     Social History Social History   Tobacco Use  . Smoking status: Current Every Day Smoker    Packs/day: 1.00  . Smokeless tobacco: Never Used  Substance Use Topics  . Alcohol use: Yes    Comment: social  . Drug use: Yes    Types: Marijuana    Comment: States he smokes a lot of Marijuana since he was 11     Allergies   Patient has no known allergies.   Review of Systems Review of Systems  Constitutional: Negative for chills and fever.  HENT: Negative for ear pain and sore throat.   Eyes: Negative for pain and visual disturbance.  Respiratory: Negative for cough and shortness of breath.   Cardiovascular: Negative for chest pain and palpitations.  Gastrointestinal: Negative for abdominal pain and vomiting.  Genitourinary: Negative for dysuria and hematuria.  Musculoskeletal: Negative for arthralgias and back pain.   Skin: Negative for color change and rash.  Neurological: Negative for seizures and syncope.  All other systems reviewed and are negative.    Physical Exam Triage Vital Signs ED Triage Vitals  Enc Vitals Group     BP 05/27/19 1142 136/75     Pulse --      Resp 05/27/19 1142 16     Temp 05/27/19 1142 98.6 F (37 C)     Temp Source 05/27/19 1142 Oral     SpO2 05/27/19 1142 100 %     Weight 05/27/19 1141 210 lb (95.3 kg)   No data found.  Updated Vital Signs BP 136/75 (BP Location: Right Arm)   Temp 98.6 F (37 C) (Oral)   Resp 16   Wt 95.3 kg   SpO2 100%   BMI 27.71 kg/m   Physical Exam Constitutional:      General: He is not in acute distress.    Appearance: He is well-developed and normal weight.  HENT:     Head: Normocephalic and atraumatic.  Eyes:     Conjunctiva/sclera: Conjunctivae normal.     Pupils: Pupils are equal, round, and reactive to light.  Neck:     Musculoskeletal: Normal range of motion.  Cardiovascular:     Rate and Rhythm: Normal rate.  Pulmonary:  Effort: Pulmonary effort is normal. No respiratory distress.  Abdominal:     General: There is no distension.     Palpations: Abdomen is soft.  Musculoskeletal: Normal range of motion.  Skin:    General: Skin is warm and dry.  Neurological:     Mental Status: He is alert.  Psychiatric:        Mood and Affect: Mood normal.        Behavior: Behavior normal.      UC Treatments / Results  Labs (all labs ordered are listed, but only abnormal results are displayed) Labs Reviewed - No data to display  EKG   Radiology No results found.  Procedures Procedures (including critical care time)  Medications Ordered in UC Medications  azithromycin (ZITHROMAX) tablet 1,000 mg (1,000 mg Oral Given 05/27/19 1223)  cefTRIAXone (ROCEPHIN) injection 250 mg (250 mg Intramuscular Given 05/27/19 1223)  azithromycin (ZITHROMAX) 250 MG tablet (has no administration in time range)  cefTRIAXone  (ROCEPHIN) 250 MG injection (has no administration in time range)    Initial Impression / Assessment and Plan / UC Course  I have reviewed the triage vital signs and the nursing notes.  Pertinent labs & imaging results that were available during my care of the patient were reviewed by me and considered in my medical decision making (see chart for details).     Recommended testing for STDs.  Recommended HIV and RPR.  Patient declines.  Recommended safe sex. Final Clinical Impressions(s) / UC Diagnoses   Final diagnoses:  Exposure to STD     Discharge Instructions     We are treating you with medicine protocol recommended by the health department and the CDC Avoid sexual relations for 7 days   ED Prescriptions    None     Controlled Substance Prescriptions [place] Controlled Substance Registry consulted? Not Applicable   Raylene Everts, MD 05/27/19 [phone]

## 2019-05-27 NOTE — ED Triage Notes (Signed)
Pt states he here to get treated for Chlamydia.

## 2019-08-04 ENCOUNTER — Ambulatory Visit (HOSPITAL_COMMUNITY)
Admission: EM | Admit: 2019-08-04 | Discharge: 2019-08-04 | Disposition: A | Discharge status: Home / Self Care | Payer: Medicaid Other | Attending: Family Medicine | Admitting: Family Medicine

## 2019-08-04 ENCOUNTER — Encounter (HOSPITAL_COMMUNITY): Payer: Self-pay | Admitting: Emergency Medicine

## 2019-08-04 ENCOUNTER — Other Ambulatory Visit: Payer: Self-pay

## 2019-08-04 DIAGNOSIS — W268XXA Contact with other sharp object(s), not elsewhere classified, initial encounter: Secondary | ICD-10-CM | POA: Diagnosis not present

## 2019-08-04 DIAGNOSIS — Z4802 Encounter for removal of sutures: Secondary | ICD-10-CM | POA: Diagnosis not present

## 2019-08-04 DIAGNOSIS — S61011A Laceration without foreign body of right thumb without damage to nail, initial encounter: Secondary | ICD-10-CM

## 2019-08-04 NOTE — ED Provider Notes (Signed)
  [place]   [id number] 08/04/19 Arrival Time: 9417  ASSESSMENT & PLAN:  1. Laceration of right thumb without foreign body without damage to nail, initial encounter     No signs of infection. Sutures removed by RN without complications. Activities as tolerated. May f/u here as needed.  Reviewed expectations re: course of current medical issues. Questions answered. Outlined signs and symptoms indicating need for more acute intervention. Patient verbalized understanding. After Visit Summary given.   SUBJECTIVE:  John Skinner is a 23 y.o. male who presents with a sutured laceration of his R thumb. Reports cutting thumb with suture placement approx 10 days ago. No drainage or bleeding. No extremity sensation changes or weakness.   ROS: As per HPI.   OBJECTIVE:  Vitals:   08/04/19 1343  BP: 123/85  Pulse: 93  Resp: 16  Temp: 98.4 F (36.9 C)  TempSrc: Oral  SpO2: 98%     General appearance: alert; no distress Skin: healed laceration of tip of R thumb; normal capillary refill and distal sensation of R thumb; FROM without difficulty Psychological: alert and cooperative; normal mood and affect     No Known Allergies  Past Medical History:  Diagnosis Date  . Tonsillitis    Social History   Socioeconomic History  . Marital status: Married    Spouse name: Not on file  . Number of children: Not on file  . Years of education: Not on file  . Highest education level: Not on file  Occupational History  . Not on file  Social Needs  . Financial resource strain: Not on file  . Food insecurity    Worry: Not on file    Inability: Not on file  . Transportation needs    Medical: Not on file    Non-medical: Not on file  Tobacco Use  . Smoking status: Current Every Day Smoker    Packs/day: 1.00  . Smokeless tobacco: Never Used  Substance and Sexual Activity  . Alcohol use: Yes    Comment: social  . Drug use: Yes    Types: Marijuana    Comment:  States he smokes a lot of Marijuana since he was 57  . Sexual activity: Yes    Birth control/protection: None  Lifestyle  . Physical activity    Days per week: Not on file    Minutes per session: Not on file  . Stress: Not on file  Relationships  . Social Herbalist on phone: Not on file    Gets together: Not on file    Attends religious service: Not on file    Active member of club or organization: Not on file    Attends meetings of clubs or organizations: Not on file    Relationship status: Not on file  Other Topics Concern  . Not on file  Social History Narrative  . Not on file         Vanessa Kick, MD 08/04/19 1349

## 2019-08-04 NOTE — ED Triage Notes (Signed)
PT here for suture removal. Sutures have been in place for 10 days. 5 sutures removed. No darinage, no redness, wound edges well approximated. No signs of infection.

## 2019-09-04 IMAGING — CT CT ABDOMEN AND PELVIS WITH CONTRAST
2 of 4 series · 16 of 46 positions shown, 18 images · IV contrast (Omni 300)
Comparison: Abdominal ultrasound 04/13/2011

CLINICAL DATA: Nausea and emesis since yesterday. Cholecystectomy.
Abdominal pain, acute, generalized.

EXAM:
CT ABDOMEN AND PELVIS WITH CONTRAST
TECHNIQUE: Multidetector CT imaging of the abdomen and pelvis was performed
using the standard protocol following bolus administration of
intravenous contrast.
CONTRAST:  100mL OMNIPAQUE IOHEXOL 300 MG/ML  SOLN

[Series 3: a/p w/ 5mm · axial · 0.75mm/px · z∈[-488,-18]mm · 13 of 106 slices shown, 15 images]
[im 8/106  soft-tissue]
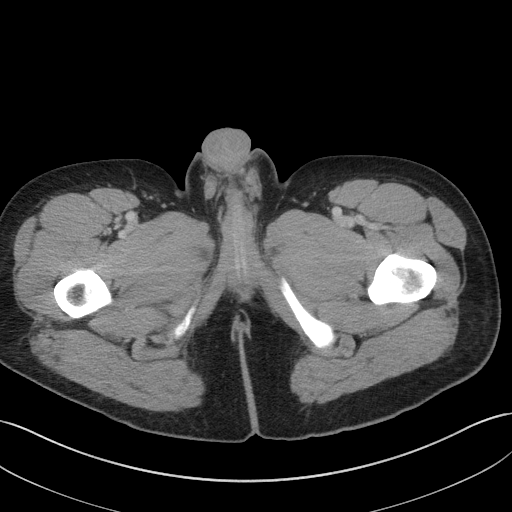
[im 8/106  bone]
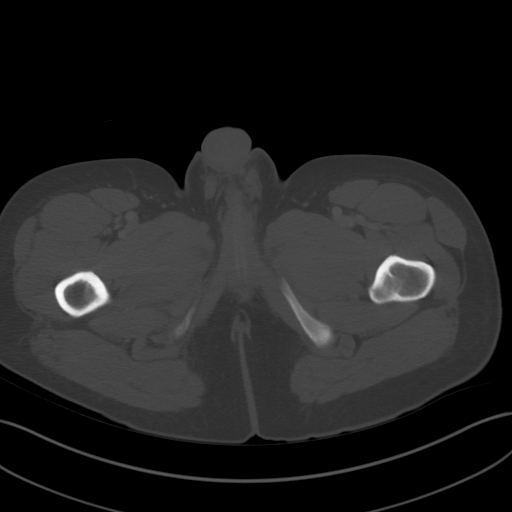
[im 16/106  soft-tissue]
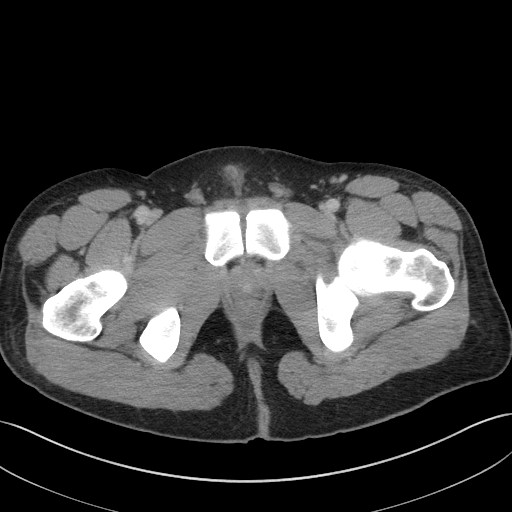
[im 24/106  soft-tissue]
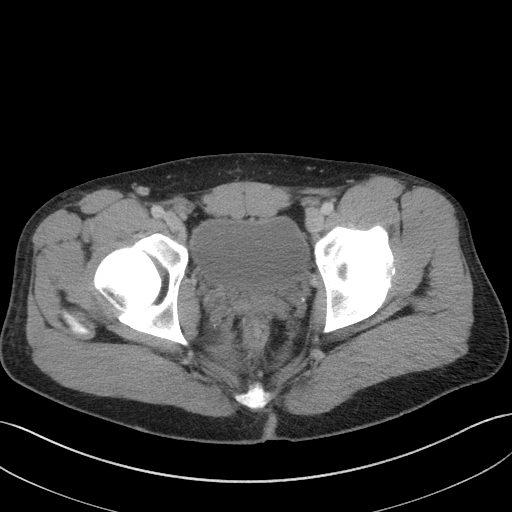
[im 32/106  soft-tissue]
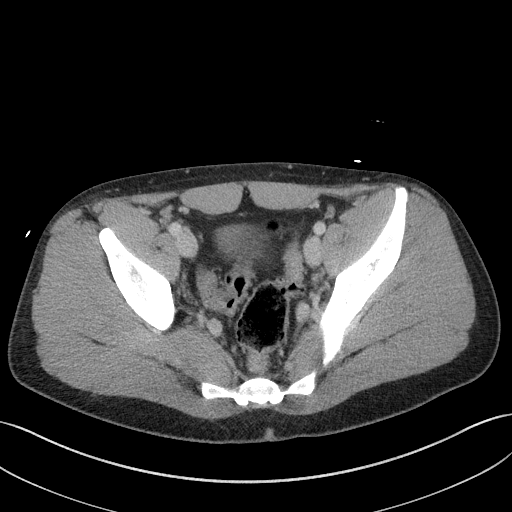
[im 39/106  soft-tissue]
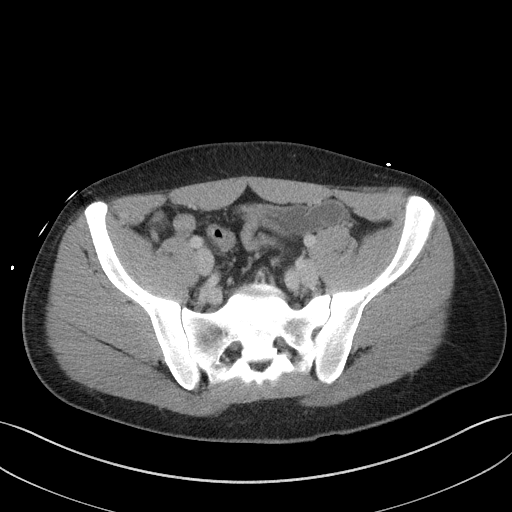
[im 47/106  soft-tissue]
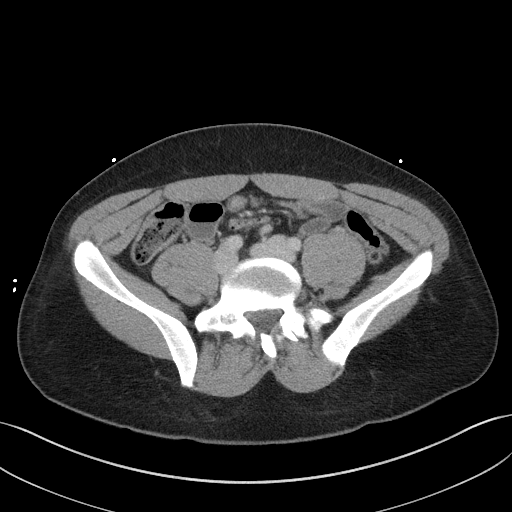
[im 55/106  soft-tissue]
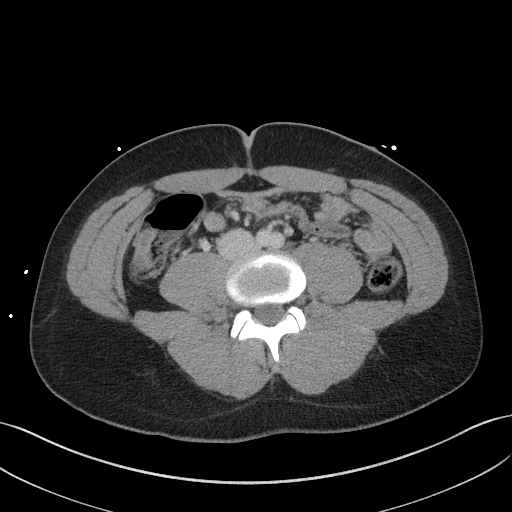
[im 63/106  soft-tissue]
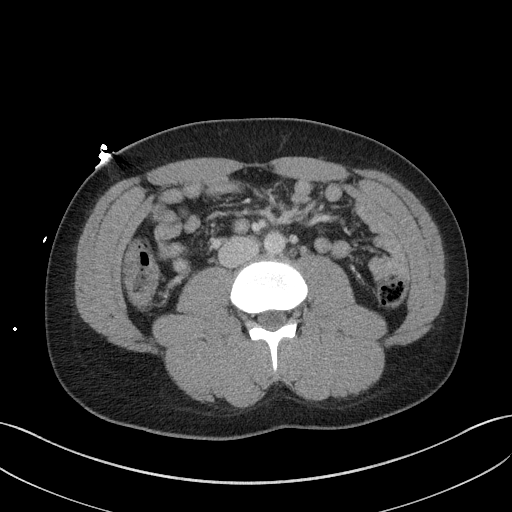
[im 71/106  soft-tissue]
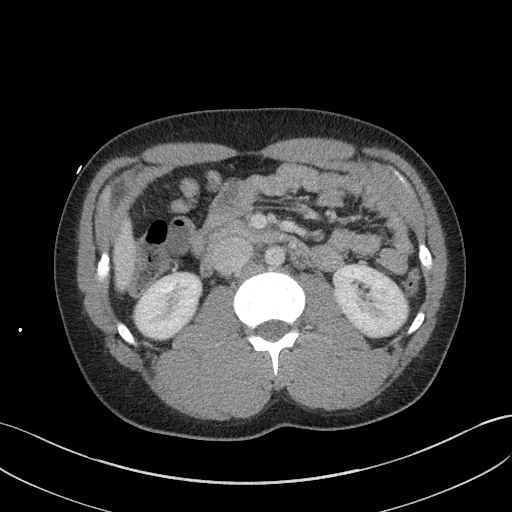
[im 71/106  bone]
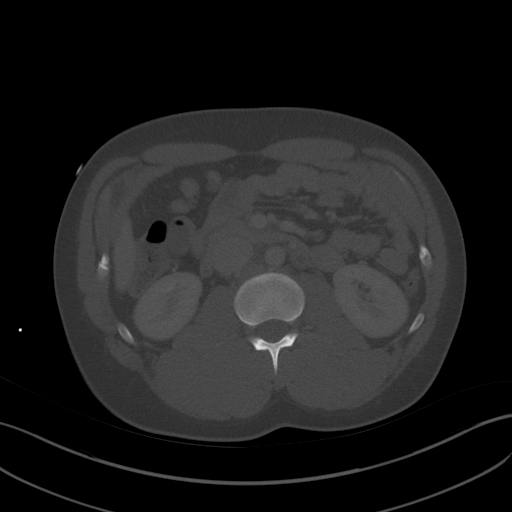
[im 78/106  soft-tissue]
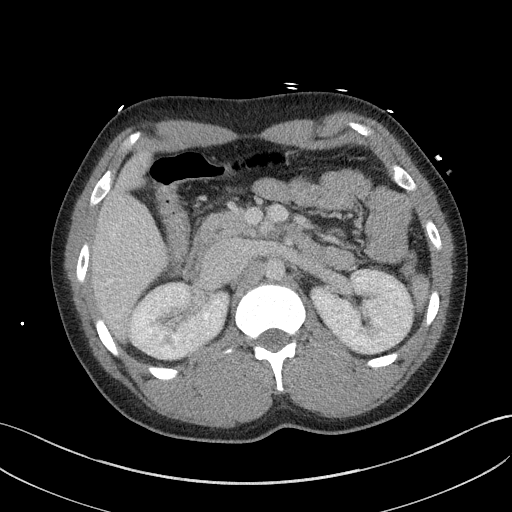
[im 86/106  soft-tissue]
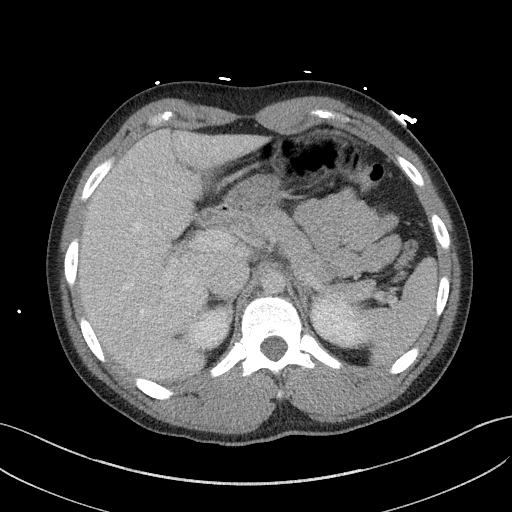
[im 94/106  soft-tissue]
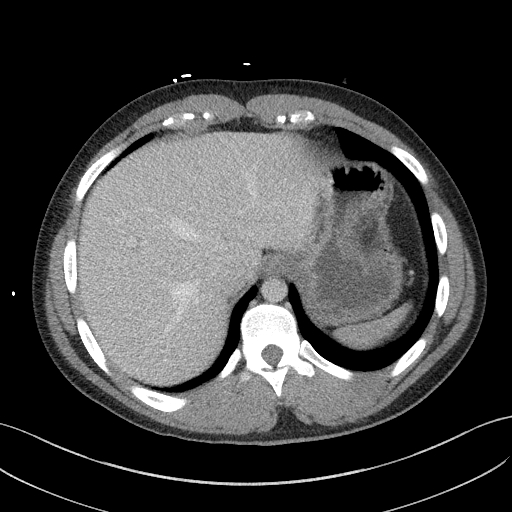
[im 102/106  soft-tissue]
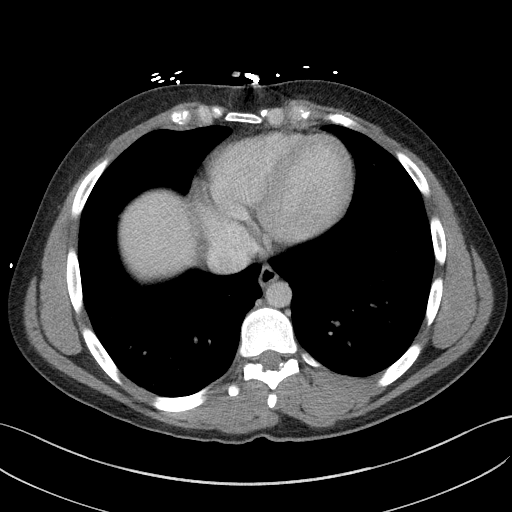

[Series 6: a/p w/ cor · coronal · 0.81mm/px · 3 of 145 slices shown]
[im 49/145  soft-tissue]
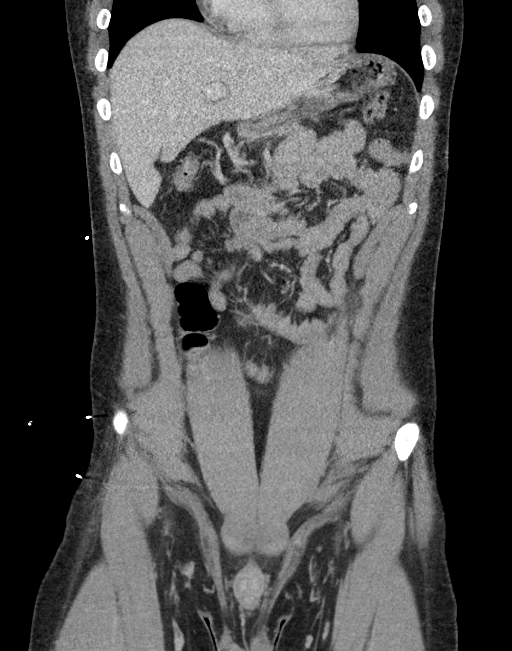
[im 65/145  soft-tissue]
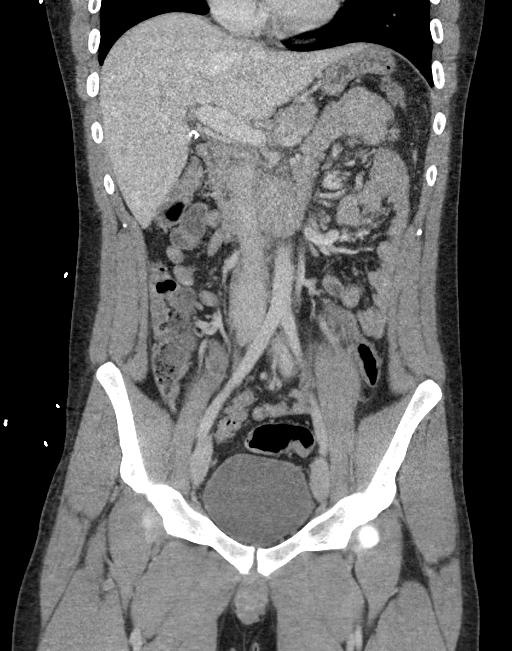
[im 81/145  soft-tissue]
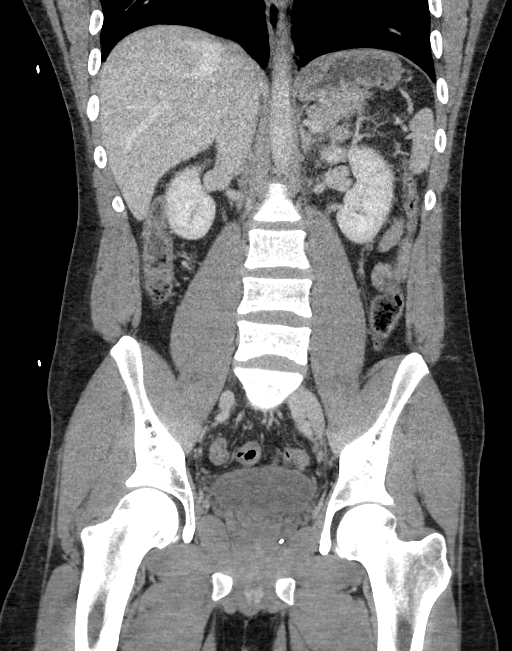

[16 of 46 positions shown; findings below may reference images not displayed]

FINDINGS: Lower chest: The lung bases are clear without focal nodule, mass, or
airspace disease.

Hepatobiliary: No focal liver abnormality is seen. Status post
cholecystectomy. No biliary dilatation.

Pancreas: Unremarkable. No pancreatic ductal dilatation or
surrounding inflammatory changes.

Spleen: Normal in size without focal abnormality.

Adrenals/Urinary Tract: Adrenal glands are normal bilaterally.
Kidneys and ureters are within normal limits. The urinary bladder is
unremarkable.

Stomach/Bowel: The stomach and duodenum are within normal limits.
Small bowel is unremarkable. Terminal ileum is normal. The appendix
is visualized and normal. The ascending and transverse colon are
normal. Descending and sigmoid colon are within normal limits.

Vascular/Lymphatic: No significant vascular findings are present. No
enlarged abdominal or pelvic lymph nodes.

Reproductive: Prostate is unremarkable.

Other: No abdominal wall hernia or abnormality. No abdominopelvic
ascites.

Musculoskeletal: A left L4 pars defect is noted. Vertebral body
heights and alignment are maintained. No focal lytic or blastic
lesions are present.
IMPRESSION: 1. No acute or focal abnormality to explain abdominal pain.
2. Cholecystectomy.
3. Left L4 pars defect without listhesis.

## 2020-07-15 ENCOUNTER — Ambulatory Visit (HOSPITAL_COMMUNITY)
Admission: EM | Admit: 2020-07-15 | Discharge: 2020-07-15 | Disposition: A | Discharge status: Home / Self Care | Payer: Medicaid Other | Attending: Physician Assistant | Admitting: Physician Assistant

## 2020-07-15 ENCOUNTER — Other Ambulatory Visit: Payer: Self-pay

## 2020-07-15 ENCOUNTER — Encounter (HOSPITAL_COMMUNITY): Payer: Self-pay | Admitting: Physician Assistant

## 2020-07-15 DIAGNOSIS — M436 Torticollis: Secondary | ICD-10-CM

## 2020-07-15 DIAGNOSIS — M546 Pain in thoracic spine: Secondary | ICD-10-CM

## 2020-07-15 MED ORDER — CYCLOBENZAPRINE HCL 10 MG PO TABS: ORAL_TABLET | ORAL | 0 refills | Status: AC

## 2020-07-15 MED ORDER — MELOXICAM 15 MG PO TABS: 15.0000 mg | ORAL_TABLET | Freq: Every day | ORAL | 0 refills | Status: AC

## 2020-07-15 NOTE — ED Provider Notes (Signed)
[facility]    CSN: [id number] Arrival date & time: 07/15/20  1038      History   Chief Complaint Chief Complaint  Patient presents with  . Neck Pain  . Back Pain    HPI John Skinner is a 24 y.o. male.    Who presents with neck, shoulder and upper back pain x 1 day. No known injury. Was "tense" at work yesterday with progression through the day and worse this am. Pain with ROM of his neck, into upper back. Feels "tight". No weakness or numbness. No prior M/S issues.      Past Medical History:  Diagnosis Date  . Tonsillitis     Patient Active Problem List   Diagnosis Date Noted  . Intractable nausea and vomiting 01/25/2019  . Intractable vomiting   . Marijuana abuse     Past Surgical History:  Procedure Laterality Date  . CHOLECYSTECTOMY    . MOUTH SURGERY         Home Medications    Prior to Admission medications   Not on File    Family History Family History  Problem Relation Age of Onset  . Hypertension Father   . Healthy Mother     Social History Social History   Tobacco Use  . Smoking status: Current Every Day Smoker    Packs/day: 1.00  . Smokeless tobacco: Never Used  Vaping Use  . Vaping Use: Never used  Substance Use Topics  . Alcohol use: Yes    Comment: occasionally  . Drug use: Yes    Types: Marijuana     Allergies   Patient has no known allergies.   Review of Systems Review of Systems  Musculoskeletal: Positive for back pain, myalgias, neck pain and neck stiffness. Negative for gait problem and joint swelling.  Skin: Negative.   Neurological: Negative for weakness and numbness.  All other systems reviewed and are negative.    Physical Exam Triage Vital Signs ED Triage Vitals [07/15/20 1216]  Enc Vitals Group     BP 127/77     Pulse Rate 75     Resp 16     Temp 98.3 F (36.8 C)     Temp Source Oral     SpO2 100 %     Weight      Height      Head Circumference      Peak Flow      Pain Score 10      Pain Loc      Pain Edu?      Excl. in GC?    No data found.  Updated Vital Signs BP 127/77   Pulse 75   Temp 98.3 F (36.8 C) (Oral)   Resp 16   SpO2 100%   Visual Acuity Right Eye Distance:   Left Eye Distance:   Bilateral Distance:    Right Eye Near:   Left Eye Near:    Bilateral Near:     Physical Exam Vitals and nursing note reviewed.  Constitutional:      General: He is not in acute distress.    Appearance: Normal appearance. He is normal weight. He is not ill-appearing.  HENT:     Head: Normocephalic and atraumatic.  Pulmonary:     Effort: Pulmonary effort is normal.  Musculoskeletal:        General: Tenderness present. No swelling.     Comments: Full but painful lateral rotation of the cervical spine, muscle tenderness along para  thoracic region and spine. No frank spasm noted  Skin:    General: Skin is warm and dry.  Neurological:     General: No focal deficit present.     Mental Status: He is alert and oriented to person, place, and time.     Sensory: No sensory deficit.     Motor: No weakness.     Gait: Gait normal.  Psychiatric:        Mood and Affect: Mood normal.        Behavior: Behavior normal.      UC Treatments / Results  Labs (all labs ordered are listed, but only abnormal results are displayed) Labs Reviewed - No data to display  EKG   Radiology No results found.  Procedures Procedures (including critical care time)  Medications Ordered in UC Medications - No data to display  Initial Impression / Assessment and Plan / UC Course  I have reviewed the triage vital signs and the nursing notes.  Pertinent labs & imaging results that were available during my care of the patient were reviewed by me and considered in my medical decision making (see chart for details).     Treat with NSAIDs, muscle relaxers and gentle ROM. If worsen in the course of the next few weeks, f/u with Orthopedics.  Final Clinical Impressions(s) / UC  Diagnoses   Final diagnoses:  None   Discharge Instructions   None    ED Prescriptions    None     PDMP not reviewed this encounter.   Riki Sheer, PA-C 07/15/20 1245

## 2020-07-15 NOTE — ED Triage Notes (Signed)
Denies injury.  C/o starting yesterday with left neck and left upper back pain since yesterday.  Upon waking this AM, pain is significantly worse.  Denies any parasthesias.

## 2020-07-15 NOTE — Discharge Instructions (Signed)
Treat with Meloxicam daily x 1-2 weeks for muscle and back pain. Use the Flexeril as needed for muscle pain. If this is not improving then f/u with Orthopedics for further evaluation. May use heating pad to help with flexibility.

## 2024-01-16 ENCOUNTER — Encounter (HOSPITAL_BASED_OUTPATIENT_CLINIC_OR_DEPARTMENT_OTHER): Payer: Self-pay | Admitting: Urology

## 2024-01-16 ENCOUNTER — Emergency Department (HOSPITAL_BASED_OUTPATIENT_CLINIC_OR_DEPARTMENT_OTHER)
Admission: EM | Admit: 2024-01-16 | Discharge: 2024-01-16 | Disposition: A | Discharge status: Home / Self Care | Attending: Emergency Medicine | Admitting: Emergency Medicine

## 2024-01-16 ENCOUNTER — Emergency Department (HOSPITAL_BASED_OUTPATIENT_CLINIC_OR_DEPARTMENT_OTHER)

## 2024-01-16 ENCOUNTER — Other Ambulatory Visit: Payer: Self-pay

## 2024-01-16 DIAGNOSIS — S6991XA Unspecified injury of right wrist, hand and finger(s), initial encounter: Secondary | ICD-10-CM | POA: Diagnosis present

## 2024-01-16 DIAGNOSIS — S61411A Laceration without foreign body of right hand, initial encounter: Secondary | ICD-10-CM | POA: Diagnosis not present

## 2024-01-16 DIAGNOSIS — W293XXA Contact with powered garden and outdoor hand tools and machinery, initial encounter: Secondary | ICD-10-CM | POA: Insufficient documentation

## 2024-01-16 DIAGNOSIS — Y92007 Garden or yard of unspecified non-institutional (private) residence as the place of occurrence of the external cause: Secondary | ICD-10-CM | POA: Insufficient documentation

## 2024-01-16 DIAGNOSIS — Y9389 Activity, other specified: Secondary | ICD-10-CM | POA: Insufficient documentation

## 2024-01-16 MED ORDER — LIDOCAINE HCL (PF) 1 % IJ SOLN
5.0000 mL | Freq: Once | INTRAMUSCULAR | Status: AC
  Administered 2024-01-16: 5 mL
  Filled 2024-01-16: qty 5

## 2024-01-16 NOTE — Discharge Instructions (Signed)
 Sutures will need to be removed in 7-10 days. Return sooner for evaluation if there is any sign of infection - increase pain or redness, swelling, drainage from the wound, fever.   Keep the wound clean and dry. Tylenol and/or ibuprofen for discomfort if needed.

## 2024-01-16 NOTE — ED Triage Notes (Addendum)
 Right hand laceration while doing yard work per pt  Bleeding controlled and wrapped with kerlix  Abrasions noted to all knuckles and swelling as well, denies any other injury  Tdap utd

## 2024-01-16 NOTE — ED Provider Notes (Signed)
 [place] [facility] Provider Note   CSN: [id number] Arrival date & time: 01/16/24  1238     History  Chief Complaint  Patient presents with   Laceration    Sender John Skinner is a 28 y.o. male.  Patient to ED with laceration to his right dorsal hand caused by hedge trimmers just before arrival. No other injury. He reports his last tetanus was less than 10 years.   The history is provided by the patient. No language interpreter was used.  Laceration      Home Medications Prior to Admission medications   Medication Sig Start Date End Date Taking? Authorizing Provider  cyclobenzaprine (FLEXERIL) 10 MG tablet 1/2-1 tablet every 8 hours as needed for muscle spasms 07/15/20   Tarri Glenn, Deanna M, PA-C  meloxicam (MOBIC) 15 MG tablet Take 1 tablet (15 mg total) by mouth daily. 07/15/20   Laddie Aquas, PA-C      Allergies    Patient has no known allergies.    Review of Systems   Review of Systems  Physical Exam Updated Vital Signs BP 136/82 (BP Location: Left Arm)   Pulse (!) 110   Temp 99.1 F (37.3 C) (Oral)   Resp 15   Ht 6\' 1"  (1.854 m)   Wt 95.3 kg   SpO2 99%   BMI 27.72 kg/m  Physical Exam Constitutional:      Appearance: He is well-developed.  Pulmonary:     Effort: Pulmonary effort is normal.  Musculoskeletal:        General: Normal range of motion.     Cervical back: Normal range of motion.     Comments: FROM all digits right hand.   Skin:    General: Skin is warm and dry.     Comments: 2 cm crescent shaped laceration over 3rd MCP joint, dorsal right hand. No exposed tendon, joint or bone.   Neurological:     Mental Status: He is alert and oriented to person, place, and time.     ED Results / Procedures / Treatments   Labs (all labs ordered are listed, but only abnormal results are displayed) Labs Reviewed - No data to display  EKG None  Radiology DG Hand Complete Right Result Date:  01/16/2024 CLINICAL DATA:  Right hand laceration and abrasions from a yard tool. EXAM: RIGHT HAND - COMPLETE 3+ VIEW COMPARISON:  07/11/2012 FINDINGS: Interval small linear and tiny calcific densities distal to the 2nd distal phalanx. No fracture or dislocation seen. No radiographically visible soft tissue laceration. IMPRESSION: Interval small linear and tiny calcific densities distal to the 2nd distal phalanx, possibly foreign bodies or tiny bone fragments. Electronically Signed   By: Beckie Salts M.D.   On: 01/16/2024 14:07    Procedures .Laceration Repair  Date/Time: 01/16/2024 2:45 PM  Performed by: Elpidio Anis, PA-C Authorized by: Elpidio Anis, PA-C   Consent:    Consent obtained:  Verbal Universal protocol:    Procedure explained and questions answered to patient or proxy's satisfaction: yes     Patient identity confirmed:  Verbally with patient Anesthesia:    Anesthesia method:  Local infiltration   Local anesthetic:  Lidocaine 1% w/o epi Laceration details:    Location:  Hand   Hand location:  R hand, dorsum   Length (cm):  2 Pre-procedure details:    Preparation:  Patient was prepped and draped in usual sterile fashion and imaging obtained to evaluate for foreign bodies Treatment:  Area cleansed with:  Povidone-iodine and saline   Amount of cleaning:  Standard   Irrigation method:  Syringe Skin repair:    Repair method:  Sutures   Suture size:  5-0   Suture material:  Prolene   Suture technique:  Simple interrupted   Number of sutures:  5 Approximation:    Approximation:  Close Repair type:    Repair type:  Simple     Medications Ordered in ED Medications  lidocaine (PF) (XYLOCAINE) 1 % injection 5 mL (5 mLs Infiltration Given by Other 01/16/24 1438)    ED Course/ Medical Decision Making/ A&P                                 Medical Decision Making Amount and/or Complexity of Data Reviewed Radiology: ordered.  Risk Prescription drug  management.           Final Clinical Impression(s) / ED Diagnoses Final diagnoses:  Laceration of right hand without foreign body, initial encounter    Rx / DC Orders ED Discharge Orders     None         Elpidio Anis, PA-C 01/16/24 1451    Long, Arlyss Repress, MD 01/19/24 [phone]
